# Patient Record
Sex: Female | Born: 1990 | Race: White | Hispanic: No | Marital: Single | Smoking: Former smoker
Health system: Southern US, Community
[De-identification: ages and names within clinical notes are randomized; demographics above are authoritative.]

## PROBLEM LIST (undated history)

## (undated) DIAGNOSIS — N946 Dysmenorrhea, unspecified: Secondary | ICD-10-CM

## (undated) DIAGNOSIS — B0059 Other herpesviral disease of eye: Secondary | ICD-10-CM

## (undated) DIAGNOSIS — F32A Depression, unspecified: Secondary | ICD-10-CM

## (undated) DIAGNOSIS — F419 Anxiety disorder, unspecified: Secondary | ICD-10-CM

## (undated) DIAGNOSIS — Z87440 Personal history of urinary (tract) infections: Secondary | ICD-10-CM

## (undated) DIAGNOSIS — L309 Dermatitis, unspecified: Secondary | ICD-10-CM

## (undated) DIAGNOSIS — R87619 Unspecified abnormal cytological findings in specimens from cervix uteri: Secondary | ICD-10-CM

## (undated) DIAGNOSIS — F329 Major depressive disorder, single episode, unspecified: Secondary | ICD-10-CM

## (undated) DIAGNOSIS — E282 Polycystic ovarian syndrome: Secondary | ICD-10-CM

## (undated) HISTORY — DX: Dysmenorrhea, unspecified: N94.6

## (undated) HISTORY — DX: Personal history of urinary (tract) infections: Z87.440

## (undated) HISTORY — DX: Dermatitis, unspecified: L30.9

## (undated) HISTORY — DX: Other herpesviral disease of eye: B00.59

## (undated) HISTORY — DX: Unspecified abnormal cytological findings in specimens from cervix uteri: R87.619

## (undated) HISTORY — DX: Polycystic ovarian syndrome: E28.2

---
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
## 2012-10-01 ENCOUNTER — Encounter (HOSPITAL_COMMUNITY): Payer: Self-pay | Admitting: Emergency Medicine

## 2012-10-01 ENCOUNTER — Emergency Department (INDEPENDENT_AMBULATORY_CARE_PROVIDER_SITE_OTHER)
Admission: EM | Admit: 2012-10-01 | Discharge: 2012-10-01 | Disposition: A | Discharge status: Home / Self Care | Payer: BC Managed Care – PPO | Source: Home / Self Care | Attending: Emergency Medicine | Admitting: Emergency Medicine

## 2012-10-01 DIAGNOSIS — B0050 Herpesviral ocular disease, unspecified: Secondary | ICD-10-CM

## 2012-10-01 MED ORDER — VALACYCLOVIR HCL 500 MG PO TABS: 500.0000 mg | ORAL_TABLET | Freq: Two times a day (BID) | ORAL | Status: DC

## 2012-10-01 MED ORDER — GANCICLOVIR 0.15 % OP GEL: OPHTHALMIC | Status: DC

## 2012-10-01 MED ORDER — ERYTHROMYCIN 5 MG/GM OP OINT: TOPICAL_OINTMENT | Freq: Four times a day (QID) | OPHTHALMIC | Status: DC

## 2012-10-01 MED ORDER — TETRACAINE HCL 0.5 % OP SOLN
OPHTHALMIC | Status: AC
  Filled 2012-10-01: qty 2

## 2012-10-01 NOTE — ED Notes (Signed)
Left eye pain, watery, red -noticed a scratchy feeling on Thursday, symptoms have progressed since then

## 2012-10-01 NOTE — ED Notes (Signed)
Patient not in room

## 2012-10-01 NOTE — ED Provider Notes (Signed)
Chief Complaint:   Chief Complaint  Patient presents with  . Eye Pain    History of Present Illness:   Savannah Patel is a 22 year old female who's had a three-day history of pain and redness in her left eye. The eye has been watering and she's had some purulent discharge and some crusting. The eye is painful, scratchy, and she has some headache on that side. Her vision is blurred and she is light sensitive. The patient furthermore gives a history of recurring herpes simplex of the cornea. This is been going on yearly since she was 22 years old. She is being followed by a Dr. Audria Nine in [place] whose phone number is [phone]. She's not taking any medication for this right now. She states that this episode feels like a typical flareup of her herpes simplex.  Review of Systems:  Other than noted above, the patient denies any of the following symptoms: Systemic:  No fever, chills, sweats, fatigue, or weight loss. Eye:  No redness, eye pain, photophobia, discharge, blurred vision, or diplopia. ENT:  No nasal congestion, rhinorrhea, or sore throat. Lymphatic:  No adenopathy. Skin:  No rash or pruritis.  PMFSH:  Past medical history, family history, social history, meds, and allergies were reviewed.   Physical Exam:   Vital signs:  BP 109/66  Pulse 68  Temp(Src) 97.9 F (36.6 C) (Oral)  Resp 17  SpO2 98%  LMP 09/10/2012 General:  Alert and in no distress. Eye:  Eye lids were normal. The conjunctiva is injected and there is watery drainage. Anterior chambers normal, PERRLA, full EOMs, fundi benign. The cornea was stained with fluorescein yielding an ulceration of the cornea just medial to the pupil which was somewhat jagged in nature. ENT:  TMs and canals clear.  Nasal mucosa normal.  No intra-oral lesions, mucous membranes moist, pharynx clear. Neck:  No adenopathy tenderness or mass. Skin:  Clear, warm and dry.    Course in Urgent Care Center:   I called her ophthalmologist, Dr.  Audria Nine who was kind enough to answer me right away and he gave me some very good advice on what to do. He recommended ganciclovir gel 5 times a day until clear and then 3 times a day and oral Valtrex 500 mg 3 times a day for 7-10 days. He wanted to see her early next week in his office.  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 Her ophthalmologist has given the explicit instructions on how to treat this and wishes to see her early next week. She understands these instructions and will call him on Monday morning first thing.  Plan:   1.  The following meds were prescribed:   Discharge Medication List as of 10/01/2012  6:13 PM    START taking these medications   Details  erythromycin ophthalmic ointment Place into the left eye every 6 (six) hours., Starting 10/01/2012, Until Discontinued, Normal    Ganciclovir (ZIRGAN) 0.15 % GEL Apply to left eye every 4 hours while awake, 5 times daily., Normal    valACYclovir (VALTREX) 500 MG tablet Take 1 tablet (500 mg total) by mouth 2 (two) times daily., Starting 10/01/2012, Until Discontinued, Normal       2.  The patient was instructed in symptomatic care and handouts were given. 3.  The patient was told to return if becoming worse in any way, if no better in 3 or 4 days, and given some red  flag symptoms such as change in vision or worsening pain that would indicate earlier return. 4.  Follow up with Dr. Audria Nine next week.     Reuben Likes, MD 10/01/12 [phone]

## 2012-10-27 ENCOUNTER — Encounter: Payer: Self-pay | Admitting: Gynecology

## 2012-10-27 ENCOUNTER — Ambulatory Visit (INDEPENDENT_AMBULATORY_CARE_PROVIDER_SITE_OTHER): Payer: BC Managed Care – PPO | Admitting: Gynecology

## 2012-10-27 VITALS — BP 102/68 | HR 72 | Resp 16 | Ht 65.5 in | Wt 107.0 lb

## 2012-10-27 DIAGNOSIS — Z01419 Encounter for gynecological examination (general) (routine) without abnormal findings: Secondary | ICD-10-CM

## 2012-10-27 DIAGNOSIS — Z0000 Encounter for general adult medical examination without abnormal findings: Secondary | ICD-10-CM

## 2012-10-27 DIAGNOSIS — E282 Polycystic ovarian syndrome: Secondary | ICD-10-CM

## 2012-10-27 DIAGNOSIS — Z113 Encounter for screening for infections with a predominantly sexual mode of transmission: Secondary | ICD-10-CM

## 2012-10-27 DIAGNOSIS — Z309 Encounter for contraceptive management, unspecified: Secondary | ICD-10-CM

## 2012-10-27 DIAGNOSIS — Z124 Encounter for screening for malignant neoplasm of cervix: Secondary | ICD-10-CM

## 2012-10-27 LAB — POCT URINALYSIS DIPSTICK: Urobilinogen, UA: NEGATIVE

## 2012-10-27 LAB — HEMOGLOBIN, FINGERSTICK: Hemoglobin, fingerstick: 13.8 g/dL (ref 12.0–16.0)

## 2012-10-27 MED ORDER — MISOPROSTOL 200 MCG PO TABS: ORAL_TABLET | ORAL | Status: DC

## 2012-10-27 MED ORDER — MEDROXYPROGESTERONE ACETATE 10 MG PO TABS: 10.0000 mg | ORAL_TABLET | Freq: Every day | ORAL | Status: DC

## 2012-10-27 NOTE — Progress Notes (Signed)
22 y.o. Single Caucasian female  G0 here for annual exam. Pt is currently sexually active.  She reports using condoms on a regular basis (most of the time).  First sexual activity at 22 years old, 4 number of lifetime partners. First female partner at 37.  "UTI" began 1.57m ago with the female partner, intermittent condom use, burning, uncomfortable.  Pt does not void immediately after sex.  Current partner for 73m.   Pt reports has PCOS with oligomenorrhea-approx 4 spontaneous cycles/y.  Has been treated with OC but reports emotional side effects-suicidal ideation, no attempts, lasted 47m, 6y ago.  Pt had seen psych during this time-no rx given.  Pt does not recall oc used.  Pt was in lesbian relationship at that time and did not need contraception.  Patient's last menstrual period was 10/13/2012.          Sexually active: yes  The current method of family planning is condoms.    Exercising: yes  jogging,walking,swimming 2x/wk Last pap: never had one Alcohol: 3 drinks/wk Tobacco: 1/2 pack qd Drugs: no Gardisil: yes, completed: 2009  Hgb: 13.8     Urine Blood 2 ; Leuks 1   No health maintenance topics applied.  Family History  Problem Relation Age of Onset  . Colon cancer Mother   . Hypertension Mother   . Ovarian cysts Mother   . Osteoporosis Maternal Grandmother   . Brain cancer Paternal Grandfather     Patient Active Problem List   Diagnosis Date Noted  . PCOS (polycystic ovarian syndrome)     Past Medical History  Diagnosis Date  . PCOS (polycystic ovarian syndrome) 2007  . Dysmenorrhea   . Herpes simplex infection of eyelid   . History of recurrent UTIs     No past surgical history on file.  Allergies: Azithromycin and Penicillins  Current Outpatient Prescriptions  Medication Sig Dispense Refill  . Loratadine (CLARITIN PO) Take by mouth daily.       No current facility-administered medications for this visit.    ROS: Pertinent items are noted in HPI.  Exam:    LMP  10/13/2012 Weight change: @WEIGHTCHANGE @ Last 3 height recordings:  Ht Readings from Last 3 Encounters:  No data found for Ht   General appearance: alert, cooperative and appears stated age Head: Normocephalic, without obvious abnormality, atraumatic Neck: no adenopathy, no carotid bruit, no JVD, supple, symmetrical, trachea midline and thyroid not enlarged, symmetric, no tenderness/mass/nodules Lungs: clear to auscultation bilaterally Breasts: normal appearance, no masses or tenderness Heart: regular rate and rhythm, S1, S2 normal, no murmur, click, rub or gallop Abdomen: soft, non-tender; bowel sounds normal; no masses,  no organomegaly Extremities: extremities normal, atraumatic, no cyanosis or edema Skin: Skin color, texture, turgor normal. No rashes or lesions Lymph nodes: Cervical, supraclavicular, and axillary nodes normal. no inguinal nodes palpated Neurologic: Grossly normal   Pelvic: External genitalia:  no lesions              Urethra: normal appearing urethra with no masses, tenderness or lesions              Bartholins and Skenes: normal                 Vagina: normal appearing vagina with normal color and discharge, no lesions              Cervix: normal appearance              Pap taken: yes  Bimanual Exam:  Uterus:  uterus is normal size, shape, consistency and nontender                                      Adnexa:    normal adnexa in size, nontender and no masses                                      Rectovaginal: Confirms                                      Anus:  normal sphincter tone, no lesions  A: well woman Contraceptive management     P: pap smear guidelines reviewed counseled on breast self exam, STD prevention, smoking cessation 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   An After Visit Summary was printed and given to the patient.

## 2012-10-27 NOTE — Patient Instructions (Signed)
Polycystic Ovarian Syndrome Polycystic ovarian syndrome is a condition with a number of problems. One problem is with the ovaries. The ovaries are organs located in the female pelvis, on each side of the uterus. Usually, during the menstrual cycle, an egg is released from 1 ovary every month. This is called ovulation. When the egg is fertilized, it goes into the womb (uterus), which allows for the growth of a baby. The egg travels from the ovary through the fallopian tube to the uterus. The ovaries also make the hormones estrogen and progesterone. These hormones help the development of a woman's breasts, body shape, and body hair. They also regulate the menstrual cycle and pregnancy. Sometimes, cysts form in the ovaries. A cyst is a fluid-filled sac. On the ovary, different types of cysts can form. The most common type of ovarian cyst is called a functional or ovulation cyst. It is normal, and often forms during the normal menstrual cycle. Each month, a woman's ovaries grow tiny cysts that hold the eggs. When an egg is fully grown, the sac breaks open. This releases the egg. Then, the sac which released the egg from the ovary dissolves. In one type of functional cyst, called a follicle cyst, the sac does not break open to release the egg. It may actually continue to grow. This type of cyst usually disappears within 1 to 3 months.  One type of cyst problem with the ovaries is called Polycystic Ovarian Syndrome (PCOS). In this condition, many follicle cysts form, but do not rupture and produce an egg. This health problem can affect the following:  Menstrual cycle.  Heart.  Obesity.  Cancer of the uterus.  Fertility.  Blood vessels.  Hair growth (face and body) or baldness.  Hormones.  Appearance.  High blood pressure.  Stroke.  Insulin production.  Inflammation of the liver.  Elevated blood cholesterol and triglycerides. CAUSES   No one knows the exact cause of PCOS.  Women with  PCOS often have a mother or sister with PCOS. There is not yet enough proof to say this is inherited.  Many women with PCOS have a weight problem.  Researchers are looking at the relationship between PCOS and the body's ability to make insulin. Insulin is a hormone that regulates the change of sugar, starches, and other food into energy for the body's use, or for storage. Some women with PCOS make too much insulin. It is possible that the ovaries react by making too many female hormones, called androgens. This can lead to acne, excessive hair growth, weight gain, and ovulation problems.  Too much production of luteinizing hormone (LH) from the pituitary gland in the brain stimulates the ovary to produce too much female hormone (androgen). SYMPTOMS   Infrequent or no menstrual periods, and/or irregular bleeding.  Inability to get pregnant (infertility), because of not ovulating.  Increased growth of hair on the face, chest, stomach, back, thumbs, thighs, or toes.  Acne, oily skin, or dandruff.  Pelvic pain.  Weight gain or obesity, usually carrying extra weight around the waist.  Type 2 diabetes (this is the diabetes that usually does not need insulin).  High cholesterol.  High blood pressure.  Female-pattern baldness or thinning hair.  Patches of thickened and dark brown or black skin on the neck, arms, breasts, or thighs.  Skin tags, or tiny excess flaps of skin, in the armpits or neck area.  Sleep apnea (excessive snoring and breathing stops at times while asleep).  Deepening of the voice.  Gestational diabetes when pregnant.  Increased risk of miscarriage with pregnancy. DIAGNOSIS  There is no single test to diagnose PCOS.   Your caregiver will:  Take a medical history.  Perform a pelvic exam.  Perform an ultrasound.  Check your female and female hormone levels.  Measure glucose or sugar levels in the blood.  Do other blood tests.  If you are producing too many  female hormones, your caregiver will make sure it is from PCOS. At the physical exam, your caregiver will want to evaluate the areas of increased hair growth. Try to allow natural hair growth for a few days before the visit.  During a pelvic exam, the ovaries may be enlarged or swollen by the increased number of small cysts. This can be seen more easily by vaginal ultrasound or screening, to examine the ovaries and lining of the uterus (endometrium) for cysts. The uterine lining may become thicker, if there has not been a regular period. TREATMENT  Because there is no cure for PCOS, it needs to be managed to prevent problems. Treatments are based on your symptoms. Treatment is also based on whether you want to have a baby or whether you need contraception.  Treatment may include:  Progesterone hormone, to start a menstrual period.  Birth control pills, to make you have regular menstrual periods.  Medicines to make you ovulate, if you want to get pregnant.  Medicines to control your insulin.  Medicine to control your blood pressure.  Medicine and diet, to control your high cholesterol and triglycerides in your blood.  Surgery, making small holes in the ovary, to decrease the amount of female hormone production. This is done through a long, lighted tube (laparoscope), placed into the pelvis through a tiny incision in the lower abdomen. Your caregiver will go over some of the choices with you. WOMEN WITH PCOS HAVE THESE CHARACTERISTICS:  High levels of female hormones called androgens.  An irregular or no menstrual cycle.  May have many small cysts in their ovaries. PCOS is the most common hormonal reproductive problem in women of childbearing age. WHY DO WOMEN WITH PCOS HAVE TROUBLE WITH THEIR MENSTRUAL CYCLE? Each month, about 20 eggs start to mature in the ovaries. As one egg grows and matures, the follicle breaks open to release the egg, so it can travel through the fallopian tube for  fertilization. When the single egg leaves the follicle, ovulation takes place. In women with PCOS, the ovary does not make all of the hormones it needs for any of the eggs to fully mature. They may start to grow and accumulate fluid, but no one egg becomes large enough. Instead, some may remain as cysts. Since no egg matures or is released, ovulation does not occur and the hormone progesterone is not made. Without progesterone, a woman's menstrual cycle is irregular or absent. Also, the cysts produce female hormones, which continue to prevent ovulation.  Document Released: 08/14/2004 Document Revised: 07/13/2011 Document Reviewed: 03/08/2009 [facility] Patient Information 2014 [place], [place]. Levonorgestrel intrauterine device (IUD) What is this medicine? LEVONORGESTREL IUD (LEE voe nor jes trel) is a contraceptive (birth control) device. It is used to prevent pregnancy and to treat heavy bleeding that occurs during your period. It can be used for up to 5 years. This medicine may be used for other purposes; ask your health care provider or pharmacist if you have questions. What should I tell my health care provider before I take this medicine? They need to know if you have  any of these conditions: -abnormal Pap smear -cancer of the breast, uterus, or cervix -diabetes -endometritis -genital or pelvic infection now or in the past -have more than one sexual partner or your partner has more than one partner -heart disease -history of an ectopic or tubal pregnancy -immune system problems -IUD in place -liver disease or tumor -problems with blood clots or take blood-thinners -use intravenous drugs -uterus of unusual shape -vaginal bleeding that has not been explained -an unusual or allergic reaction to levonorgestrel, other hormones, silicone, or polyethylene, medicines, foods, dyes, or preservatives -pregnant or trying to get pregnant -breast-feeding How should I use this medicine? This device is  placed inside the uterus by a health care professional. Talk to your pediatrician regarding the use of this medicine in children. Special care may be needed. Overdosage: If you think you have taken too much of this medicine contact a poison control center or emergency room at once. NOTE: This medicine is only for you. Do not share this medicine with others. What if I miss a dose? This does not apply. What may interact with this medicine? Do not take this medicine with any of the following medications: -amprenavir -bosentan -fosamprenavir This medicine may also interact with the following medications: -aprepitant -barbiturate medicines for inducing sleep or treating seizures -bexarotene -griseofulvin -medicines to treat seizures like carbamazepine, ethotoin, felbamate, oxcarbazepine, phenytoin, topiramate -modafinil -pioglitazone -rifabutin -rifampin -rifapentine -some medicines to treat HIV infection like atazanavir, indinavir, lopinavir, nelfinavir, tipranavir, ritonavir -St. John's wort -warfarin This list may not describe all possible interactions. Give your health care provider a list of all the medicines, herbs, non-prescription drugs, or dietary supplements you use. Also tell them if you smoke, drink alcohol, or use illegal drugs. Some items may interact with your medicine. What should I watch for while using this medicine? Visit your doctor or health care professional for regular check ups. See your doctor if you or your partner has sexual contact with others, becomes HIV positive, or gets a sexual transmitted disease. This product does not protect you against HIV infection (AIDS) or other sexually transmitted diseases. You can check the placement of the IUD yourself by reaching up to the top of your vagina with clean fingers to feel the threads. Do not pull on the threads. It is a good habit to check placement after each menstrual period. Call your doctor right away if you feel  more of the IUD than just the threads or if you cannot feel the threads at all. The IUD may come out by itself. You may become pregnant if the device comes out. If you notice that the IUD has come out use a backup birth control method like condoms and call your health care provider. Using tampons will not change the position of the IUD and are okay to use during your period. What side effects may I notice from receiving this medicine? Side effects that you should report to your doctor or health care professional as soon as possible: -allergic reactions like skin rash, itching or hives, swelling of the face, lips, or tongue -fever, flu-like symptoms -genital sores -high blood pressure -no menstrual period for 6 weeks during use -pain, swelling, warmth in the leg -pelvic pain or tenderness -severe or sudden headache -signs of pregnancy -stomach cramping -sudden shortness of breath -trouble with balance, talking, or walking -unusual vaginal bleeding, discharge -yellowing of the eyes or skin Side effects that usually do not require medical attention (report to your doctor or health care  professional if they continue or are bothersome): -acne -breast pain -change in sex drive or performance -changes in weight -cramping, dizziness, or faintness while the device is being inserted -headache -irregular menstrual bleeding within first 3 to 6 months of use -nausea This list may not describe all possible side effects. Call your doctor for medical advice about side effects. You may report side effects to FDA at 1-800-FDA-1088. Where should I keep my medicine? This does not apply. NOTE: This sheet is a summary. It may not cover all possible information. If you have questions about this medicine, talk to your doctor, pharmacist, or health care provider.  2012, [employer]vier/[employer] Standard. (05/11/2008 6:39:08 PM)

## 2012-10-28 LAB — RPR

## 2012-10-28 LAB — HIV ANTIBODY (ROUTINE TESTING W REFLEX): HIV: NONREACTIVE

## 2012-10-30 LAB — URINE CULTURE

## 2012-10-31 ENCOUNTER — Telehealth: Payer: Self-pay | Admitting: *Deleted

## 2012-10-31 ENCOUNTER — Other Ambulatory Visit: Payer: Self-pay | Admitting: Gynecology

## 2012-10-31 MED ORDER — CIPROFLOXACIN HCL 500 MG PO TABS: 500.0000 mg | ORAL_TABLET | Freq: Two times a day (BID) | ORAL | Status: DC

## 2012-10-31 NOTE — Telephone Encounter (Signed)
Message copied by Lorraine Lax on Mon Oct 31, 2012  5:41 PM ------      Message from: Douglass Rivers      Created: Mon Oct 31, 2012  4:20 AM       +UTI, Cipro sent to pharmacy, please inform pt, if not better after treatment, should come in for test of cure urine,      Also BV on PAP, recall 2, may treat if symptoms, metrogel for 5 nights-not called in      GC, CTM neg ------

## 2012-10-31 NOTE — Telephone Encounter (Signed)
Left Message To Call Back  

## 2012-11-01 ENCOUNTER — Other Ambulatory Visit: Payer: Self-pay | Admitting: *Deleted

## 2012-11-01 MED ORDER — METRONIDAZOLE 0.75 % VA GEL: 1.0000 | Freq: Two times a day (BID) | VAGINAL | Status: DC

## 2012-11-01 NOTE — Telephone Encounter (Signed)
Per Dr. Farrel Gobble pt had BV on papsmear (see labs) okay to send in metrogel x 5 nights. [facility] sent [facility] to [facility] pt is aware.

## 2012-11-02 ENCOUNTER — Telehealth: Payer: Self-pay | Admitting: Gynecology

## 2012-11-02 NOTE — Telephone Encounter (Signed)
LMTCB to discuss insurance benefits for IUD placement and the process for scheduling placement.

## 2012-11-03 NOTE — Telephone Encounter (Signed)
Pt notified 11/02/12 see labs 

## 2012-11-21 ENCOUNTER — Telehealth: Payer: Self-pay | Admitting: Gynecology

## 2012-11-21 NOTE — Telephone Encounter (Signed)
Patient needs to schedule and appointment for IUD. She started yesterday .

## 2012-11-21 NOTE — Telephone Encounter (Signed)
Will she need cytotec for IUD skyla placement.?

## 2012-11-21 NOTE — Telephone Encounter (Signed)
Patient is 22 y/o G0, seen for NGYN exam on 10-27-12.

## 2012-11-22 ENCOUNTER — Other Ambulatory Visit: Payer: Self-pay | Admitting: Orthopedic Surgery

## 2012-11-22 NOTE — Telephone Encounter (Signed)
Per TL, pt to have Cytotec the night before and the morning of the insertion. RX sent to [facility] [address] LMTCB for pt re: scheduling appt.

## 2012-11-22 NOTE — Telephone Encounter (Signed)
Spoke with pt about scheduling IUD insertion. Offered appt Thursday at 3 pm with TL. Pt cannot come then. Pt started menses on Sunday, and needs placement by Thursday. Pt wishes to wait until next month to schedule. Instructed to call as soon as she starts her cycle. Advised of med Cytotec called to her pharmacy. Pt to take 1 the night before the appt for insertion, and 1 the morning of. Advised pt to take 800 mg ibuprofen an hour before the appt as well with food. Pt agreeable.

## 2012-12-13 NOTE — Telephone Encounter (Signed)
[facility] [address]. Antibiotic is making her sick and is requesting another RX. The patient stopped taking the antibiotic due to it giving her hives. Patient now having back pain and blood in her urine. Please advise?

## 2012-12-13 NOTE — Telephone Encounter (Signed)
Patient calls stating she wants Rx for antibiotic for UTI. States the antibiotic she was given at last visit 10/31/2012 she could not take because it gave her hives. Asked if she let anybody know this and she said NO. Patient did use Metrogel for BV on 11/01/2012. Today she is calling from out of town and is having no fever, is having abdomenal pain and low back pain when she wakes up in the mornings and a lot of blood in her urine for the past couple of days.  Explained to patient that she needs to go as soon as we get through talking to E,R or a Urgent Care to have this seen about.  Explained to patient she will need to follow up here at our office also. Patient said OK but did not sound concerned.

## 2012-12-19 ENCOUNTER — Telehealth: Payer: Self-pay | Admitting: Gynecology

## 2012-12-19 NOTE — Telephone Encounter (Signed)
No voice mail message set up to leave a message on CB#.

## 2012-12-19 NOTE — Telephone Encounter (Signed)
Patient needs to schedule an appointment for IUD placement . Started her period on Saturday.

## 2012-12-20 ENCOUNTER — Telehealth: Payer: Self-pay | Admitting: *Deleted

## 2012-12-20 NOTE — Telephone Encounter (Signed)
Left message on patient CB#VM of need to return call concerning scheduling appt. For IUD placement. Patient also needs to give information concerning UTI diagnosis earlier this month.

## 2013-02-16 ENCOUNTER — Other Ambulatory Visit: Payer: Self-pay | Admitting: Allergy and Immunology

## 2013-02-16 ENCOUNTER — Ambulatory Visit
Admission: RE | Admit: 2013-02-16 | Discharge: 2013-02-16 | Disposition: A | Discharge status: Home / Self Care | Payer: BC Managed Care – PPO | Source: Ambulatory Visit | Attending: Allergy and Immunology | Admitting: Allergy and Immunology

## 2013-02-16 DIAGNOSIS — J45909 Unspecified asthma, uncomplicated: Secondary | ICD-10-CM

## 2013-02-17 ENCOUNTER — Ambulatory Visit (INDEPENDENT_AMBULATORY_CARE_PROVIDER_SITE_OTHER): Payer: BC Managed Care – PPO | Admitting: Gynecology

## 2013-02-17 VITALS — BP 100/56 | HR 66 | Resp 18 | Ht 65.5 in | Wt 112.0 lb

## 2013-02-17 DIAGNOSIS — Z309 Encounter for contraceptive management, unspecified: Secondary | ICD-10-CM

## 2013-02-17 DIAGNOSIS — E282 Polycystic ovarian syndrome: Secondary | ICD-10-CM

## 2013-02-17 NOTE — Progress Notes (Signed)
71 yrsSingle Caucasian female presents for  insertion of skyla. Denies any vaginal symptoms or STD concerns.  Pt reports LMP end of 02/15/2013 but last sex was early August.  Pt reports having negative upt at PCP wants IUD for treatment of severe PCOS, could not tolerate ocp due to suicidal ideation related to estrogen.  Was in lesbian relationship at that time so contraception not needed  Pt has had first female partner at 68 and most recently has had sex with female  LMP: 02/15/2013  Patient read information regarding IUD insertion.  All questions addressed.    Healthy female,time, place and personno breast pain or new or enlarging lumps on self exam Abdomen: soft, non-tender Groinno inguinal nodes palpated  Pelvic exam: Vulva;normal  Vagina:normal vagina  Cervix:Non-tender, Negative CMT, no lesions or redness, nulliparous/parous os  Uterus:normal shape, position and consistency, retroverted, mobile, non tender   Lab:neg GC/CTM  Procedure:  Bimanual exam for orientation of uterus, Speculum inserted into vagina. Cervix visualized and cleansed with hibiclens solution X 3. Tenaculumgently placed on cervix at 12 o'clock position(s).  Uterus sounded to 6 centimeters.  IUD removed from sterile packet and under sterile conditions inserted to fundus of uterus.  Introducer removed without difficulty.  IUD string trimmed to 2 centimeters.  Remainder string given to patient to feel for identification.  Tenaculum removed.  No bleeding noted.  Speculum removed.  Uterus palpated normal.  Patient tolerated procedure well.  A: Insertion of Lot # [id number], Expiration date 1/17, SKYLA   P:  Instructions and warnings signs given.       IUD identification card given with IUD removal 02/2106       Return visit 22m

## 2013-03-23 ENCOUNTER — Telehealth: Payer: Self-pay | Admitting: Gynecology

## 2013-03-23 NOTE — Telephone Encounter (Signed)
Pt says she has been bleeding for a month and would like an appointment for fu from skyla procedure.

## 2013-03-23 NOTE — Telephone Encounter (Signed)
Spoke with patient. She states she has been having vaginal bleeding since she was on her period and had Skyla inserted. Some days are heavier than others. States today is "a light day" and wearing liner. She is having intermittent cramps. Patient is due for IUD recheck as well. She denies fevers or abdominal pain, just intermittent cramps. Denies vaginal discharge.  Offered OV for today (with another provider) and tomorrow with Dr. Farrel Gobble, patient declines, has testing scheduled. Offered Monday, patient is agreeable to 6:30 appointment.   Advised patient to call back if bleeding worsens or soaking through 1 pad/tampon per hour, that is an emergency and will need immediate attention. Patient verbalized understanding. Will call back prn and will come for appointment.

## 2013-03-27 ENCOUNTER — Ambulatory Visit (INDEPENDENT_AMBULATORY_CARE_PROVIDER_SITE_OTHER): Payer: BC Managed Care – PPO | Admitting: Gynecology

## 2013-03-27 ENCOUNTER — Telehealth: Payer: Self-pay | Admitting: *Deleted

## 2013-03-27 VITALS — BP 98/68 | HR 80 | Resp 12 | Ht 65.5 in | Wt <= 1120 oz

## 2013-03-27 DIAGNOSIS — Z975 Presence of (intrauterine) contraceptive device: Secondary | ICD-10-CM

## 2013-03-27 NOTE — Progress Notes (Signed)
Subjective:     Patient ID: Savannah Patel, female   DOB: 29-Oct-1990, 22 y.o.   MRN: [id number]  HPI Comments: Pt here for IUD check, placed 6w ago, using approx 2 pantilines/d.  noticed an increase in bleeding for 2d 2w ago c/w menses.  Bleeding profile improving.  Pt reports still cramping, using 1 midol occassionally.  Not been sexually active.    Review of Systems  Constitutional: Negative for fever and chills.  Genitourinary: Positive for vaginal bleeding. Negative for vaginal discharge, vaginal pain and pelvic pain.       Objective:   Physical Exam  Constitutional: She is oriented to person, place, and time. She appears well-developed and well-nourished.  Genitourinary: There is no rash or tenderness on the right labia. There is no rash or tenderness on the left labia.  Lymphadenopathy:       Right: No inguinal adenopathy present.       Left: No inguinal adenopathy present.  Neurological: She is alert and oriented to person, place, and time.  Pelvic exam: VULVA: normal appearing vulva with no masses, tenderness or lesions, VAGINA: normal appearing vagina with normal color and discharge, no lesions, CERVIX: normal appearing cervix without discharge or lesions, cervical motion tenderness absent,IUD strings visualized UTERUS: uterus is normal size, shape, consistency and nontender, ADNEXA: normal adnexa in size, nontender and no masses.      Assessment:     IUD check     Plan:     Doing well  Pt assured re bleeding profile Can use NSAIDS if needed F/u prn

## 2013-03-27 NOTE — Telephone Encounter (Signed)
Call to patient to adjust appointment time. Unable to leave message. VM states not available, unable to leave message.

## 2013-05-22 NOTE — Telephone Encounter (Signed)
Appt completed  03-27-13.  To provider for signature. Encounter closed.

## 2013-10-16 ENCOUNTER — Telehealth: Payer: Self-pay | Admitting: Gynecology

## 2013-10-16 ENCOUNTER — Ambulatory Visit: Payer: BC Managed Care – PPO | Admitting: Certified Nurse Midwife

## 2013-10-16 ENCOUNTER — Ambulatory Visit (INDEPENDENT_AMBULATORY_CARE_PROVIDER_SITE_OTHER): Payer: BC Managed Care – PPO | Admitting: Gynecology

## 2013-10-16 VITALS — BP 96/54 | Temp 98.8°F | Resp 12 | Wt 109.0 lb

## 2013-10-16 DIAGNOSIS — R300 Dysuria: Secondary | ICD-10-CM

## 2013-10-16 DIAGNOSIS — N898 Other specified noninflammatory disorders of vagina: Secondary | ICD-10-CM

## 2013-10-16 DIAGNOSIS — Z975 Presence of (intrauterine) contraceptive device: Secondary | ICD-10-CM | POA: Insufficient documentation

## 2013-10-16 LAB — POCT URINALYSIS DIPSTICK
Blood, UA: 2
UROBILINOGEN UA: NEGATIVE
pH, UA: 5

## 2013-10-16 LAB — POCT WET PREP (WET MOUNT): Clue Cells Wet Prep Whiff POC: NEGATIVE

## 2013-10-16 MED ORDER — FLUCONAZOLE 150 MG PO TABS: 150.0000 mg | ORAL_TABLET | Freq: Once | ORAL | Status: DC

## 2013-10-16 NOTE — Telephone Encounter (Signed)
Pt having discomfort while urinating.

## 2013-10-16 NOTE — Progress Notes (Signed)
Subjective:     Patient ID: Savannah BisonJessica Patel, female   DOB: 06/07/1990, 23 y.o.   MRN: [id number]  HPI Comments: Pt here reporting 3d of clumpy discharge, itching and irritation, white, mild odor.  Pt did not use otc to treat, has concerns that she may have an STD, recent unprotected sex 1w ago. Pt reports never had yeast infection. No cycle on skyla  Dysuria  This is a new problem. The current episode started in the past 7 days. The problem has been unchanged. The quality of the pain is described as burning. There has been no fever. She is sexually active. Associated symptoms include a discharge and hesitancy. Pertinent negatives include no chills, frequency, hematuria or urgency. She has tried nothing for the symptoms.     Review of Systems  Constitutional: Negative for chills.  Genitourinary: Positive for dysuria and hesitancy. Negative for urgency, frequency and hematuria.       Objective:   Physical Exam  Nursing note and vitals reviewed. Constitutional: She is oriented to person, place, and time. She appears well-developed and well-nourished.  Neurological: She is alert and oriented to person, place, and time.  Skin: Skin is warm and dry.   Pelvic: External genitalia:  no lesions              Urethra:  normal appearing urethra with no masses, tenderness or lesions              Bartholins and Skenes: normal                 Vagina: scant clumpy discharge              Cervix: normal appearance, no discharge, strings noted, no CMT                      Bimanual Exam:  Uterus:  uterus is normal size, shape, consistency and nontender                                      Adnexa: normal adnexa in size, nontender and no masses                                        WP: few yeast    Assessment:     Symptomatic yeast     Plan:     fluconazole

## 2013-10-16 NOTE — Telephone Encounter (Addendum)
Spoke with patient. She states "I don't think this is a UTI" with c/o dysuria, "slight" abdominal pain, "weird" discharge that is thick and foul smelling per patient x 3 days. No fevers. Feels IUD strings. Can come for office visit, scheduled for 1600 with Dr. Farrel GobbleLathrop, time per Dr. Farrel GobbleLathrop.   Routing to provider for final review. Patient agreeable to disposition. Will close encounter.

## 2013-10-19 LAB — IPS N GONORRHOEA AND CHLAMYDIA BY PCR

## 2013-10-19 LAB — URINE CULTURE: Colony Count: 15000

## 2013-10-20 MED ORDER — SULFAMETHOXAZOLE-TRIMETHOPRIM 800-160 MG PO TABS: 1.0000 | ORAL_TABLET | Freq: Two times a day (BID) | ORAL | Status: DC

## 2013-10-20 NOTE — Addendum Note (Signed)
Addended by: Douglass RiversLATHROP, TRACY on: 10/20/2013 07:17 AM   Modules accepted: Orders

## 2014-01-29 ENCOUNTER — Ambulatory Visit: Payer: BC Managed Care – PPO | Admitting: Gynecology

## 2014-01-31 ENCOUNTER — Encounter: Payer: Self-pay | Admitting: Gynecology

## 2014-01-31 ENCOUNTER — Ambulatory Visit (INDEPENDENT_AMBULATORY_CARE_PROVIDER_SITE_OTHER): Payer: BC Managed Care – PPO | Admitting: Gynecology

## 2014-01-31 VITALS — BP 98/68 | HR 84 | Resp 18 | Ht 66.0 in | Wt 105.0 lb

## 2014-01-31 DIAGNOSIS — Z01419 Encounter for gynecological examination (general) (routine) without abnormal findings: Secondary | ICD-10-CM

## 2014-01-31 DIAGNOSIS — N898 Other specified noninflammatory disorders of vagina: Secondary | ICD-10-CM

## 2014-01-31 DIAGNOSIS — Z87440 Personal history of urinary (tract) infections: Secondary | ICD-10-CM

## 2014-01-31 DIAGNOSIS — Z124 Encounter for screening for malignant neoplasm of cervix: Secondary | ICD-10-CM

## 2014-01-31 DIAGNOSIS — Z113 Encounter for screening for infections with a predominantly sexual mode of transmission: Secondary | ICD-10-CM

## 2014-01-31 DIAGNOSIS — Z0000 Encounter for general adult medical examination without abnormal findings: Secondary | ICD-10-CM

## 2014-01-31 LAB — POCT URINALYSIS DIPSTICK
UROBILINOGEN UA: NEGATIVE
pH, UA: 7

## 2014-01-31 LAB — POCT WET PREP (WET MOUNT): Clue Cells Wet Prep Whiff POC: NEGATIVE

## 2014-01-31 LAB — RPR

## 2014-01-31 LAB — HEMOGLOBIN, FINGERSTICK: HEMOGLOBIN, FINGERSTICK: 14.5 g/dL (ref 12.0–16.0)

## 2014-01-31 MED ORDER — NITROFURANTOIN MONOHYD MACRO 100 MG PO CAPS: ORAL_CAPSULE | ORAL | Status: DC

## 2014-01-31 MED ORDER — FLUCONAZOLE 150 MG PO TABS: 150.0000 mg | ORAL_TABLET | Freq: Once | ORAL | Status: DC

## 2014-01-31 NOTE — Progress Notes (Signed)
23 y.o. Caucasian Caucasian female   No obstetric history on file. here for annual exam. Pt is  currently sexually active.  She reports using condoms on a regular basis.  First sexual activity at 23 years old, 5315  number of lifetime partners.  Pt has a new partner and would like STD screen, intermittent condom use.  Recent UTI, on cipro feeling much better.  Pt reports itchy vaginal discharge that started yesterday, boyfriend has jock itch.  t reports recurrent UTI after sex despite voiding immediately after and before.  Patient's last menstrual period was 11/01/2013.          Sexually active: Yes.    The current method of family planning is Skyla  IUD.    Exercising: Yes.    gym 3x/wk Last pap: 10/27/12 Negative  Alcohol:occasionaly weekends  Tobacco:  1 pack/qd  Drugs: no Gardisil: yes, completed: Age 89  Hgb: 14.5 ; Urine: Blood 2, Trace Leuks  Health Maintenance  Topic Date Due  . Tetanus/tdap  03/26/2009  . Influenza Vaccine  12/02/2013  . Pap Smear  10/28/2015    Family History  Problem Relation Age of Onset  . Colon cancer Mother   . Hypertension Mother   . Ovarian cysts Mother   . Osteoporosis Maternal Grandmother   . Brain cancer Paternal Grandfather     Patient Active Problem List   Diagnosis Date Noted  . IUD (intrauterine device) in place 10/16/2013  . PCOS (polycystic ovarian syndrome)     Past Medical History  Diagnosis Date  . PCOS (polycystic ovarian syndrome) 2007  . Dysmenorrhea   . Herpes simplex infection of eyelid   . History of recurrent UTIs     History reviewed. No pertinent past surgical history.  Allergies: Iodine; Azithromycin; and Penicillins  Current Outpatient Prescriptions  Medication Sig Dispense Refill  . Levonorgestrel (SKYLA) 13.5 MG IUD by Intrauterine route.      . Loratadine (CLARITIN PO) Take by mouth daily.      Marland Kitchen. sulfamethoxazole-trimethoprim (BACTRIM DS) 800-160 MG per tablet Take 1 tablet by mouth 2 (two) times daily. One  PO BID x 3 days  6 tablet  0  . EPINEPHRINE, ANAPHYLAXIS THERAPY AGENTS, 1 applicator as needed.       No current facility-administered medications for this visit.    ROS: Pertinent items are noted in HPI.  Exam:    BP 98/68  Pulse 84  Resp 18  Ht 5\' 6"  (1.676 m)  Wt 105 lb (47.628 kg)  BMI 16.96 kg/m2  LMP 11/01/2013 Weight change: @WEIGHTCHANGE @ Last 3 height recordings:  Ht Readings from Last 3 Encounters:  01/31/14 5\' 6"  (1.676 m)  03/27/13 5' 5.5" (1.664 m)  02/17/13 5' 5.5" (1.664 m)   General appearance: alert, cooperative and appears stated age Head: Normocephalic, without obvious abnormality, atraumatic Neck: no adenopathy, no carotid bruit, no JVD, supple, symmetrical, trachea midline and thyroid not enlarged, symmetric, no tenderness/mass/nodules Lungs: clear to auscultation bilaterally Breasts: Inspection negative, No nipple retraction or dimpling, No nipple discharge or bleeding, No axillary or supraclavicular adenopathy, Normal to palpation without dominant masses Heart: regular rate and rhythm, S1, S2 normal, no murmur, click, rub or gallop Abdomen: soft, non-tender; bowel sounds normal; no masses,  no organomegaly Extremities: extremities normal, atraumatic, no cyanosis or edema Skin: Skin color, texture, turgor normal. No rashes or lesions Lymph nodes: Cervical, supraclavicular, and axillary nodes normal. no inguinal nodes palpated Neurologic: Grossly normal   Pelvic: External genitalia:  no lesions  Urethra: normal appearing urethra with no masses, tenderness or lesions              Bartholins and Skenes: Bartholin's, Urethra, Skene's normal                 Vagina: vaginal discharge - white, copious, curd-like, odorless and thick, WET MOUNT done - results: hyphae, vaginal pH is 4.5              Cervix: normal appearance, strings noted              Pap taken: Yes.          Bimanual Exam:  Uterus:  uterus is normal size, shape, consistency and  nontender                                      Adnexa:    normal adnexa in size, nontender and no masses                                      Rectovaginal: Confirms                                      Anus:  normal sphincter tone, no lesions      P:    1. Routine gynecological examination pap smear counseled on breast self exam, feminine hygiene, diet and exercise return annually or prn Discussed STD prevention, regular condom use.  2. Laboratory examination ordered as part of a routine general medical examination  - POCT Urinalysis Dipstick - Hemoglobin, fingerstick  3. Screening for cervical cancer  - PAP with Reflex to HPV (IPS)  4. Screen for STD (sexually transmitted disease)  - N. gonorrhoea and Chlamydia by PCR (IPS) - RPR - HIV antibody  5. Vaginal discharge Yeast noted on [facility] - POCT Wet Prep (Wet Mount) - fluconazole (DIFLUCAN) 150 MG tablet; Take 1 tablet (150 mg total) by mouth once. Repeat in 3d  Dispense: 2 tablet; Refill: 0  6. History of recurrent cystitis Post-coital, continue with voiding regimen, rto if not controled with prophylaxis  - nitrofurantoin, macrocrystal-monohydrate, (MACROBID) 100 MG capsule; 1 po after sex  Dispense: 30 capsule; Refill: 2   An After Visit Summary was printed and given to the patient.

## 2014-02-01 LAB — HIV ANTIBODY (ROUTINE TESTING W REFLEX): HIV 1&2 Ab, 4th Generation: NONREACTIVE

## 2014-02-02 LAB — IPS PAP TEST WITH REFLEX TO HPV

## 2014-02-02 LAB — IPS N GONORRHOEA AND CHLAMYDIA BY PCR

## 2014-02-05 ENCOUNTER — Telehealth: Payer: Self-pay | Admitting: *Deleted

## 2014-02-05 NOTE — Telephone Encounter (Signed)
Message copied by Lorraine LaxSHAW, Rojelio Uhrich J on Mon Feb 05, 2014  9:41 AM ------      Message from: Douglass RiversLATHROP, TRACY      Created: Fri Feb 02, 2014  8:21 AM       HIV, RPR neg, GC/CTM P ------

## 2014-02-05 NOTE — Telephone Encounter (Signed)
Patient notified see result note 

## 2014-02-05 NOTE — Telephone Encounter (Signed)
Left Message To Call Back  

## 2014-02-05 NOTE — Telephone Encounter (Signed)
Pt returning call

## 2014-05-17 ENCOUNTER — Ambulatory Visit (INDEPENDENT_AMBULATORY_CARE_PROVIDER_SITE_OTHER): Payer: BLUE CROSS/BLUE SHIELD | Admitting: Nurse Practitioner

## 2014-05-17 ENCOUNTER — Encounter: Payer: Self-pay | Admitting: Nurse Practitioner

## 2014-05-17 VITALS — BP 100/60 | HR 56 | Ht 66.0 in | Wt 109.0 lb

## 2014-05-17 DIAGNOSIS — R3915 Urgency of urination: Secondary | ICD-10-CM

## 2014-05-17 DIAGNOSIS — Z113 Encounter for screening for infections with a predominantly sexual mode of transmission: Secondary | ICD-10-CM

## 2014-05-17 LAB — POCT URINALYSIS DIPSTICK
UROBILINOGEN UA: NEGATIVE
pH, UA: 5

## 2014-05-17 MED ORDER — FLUCONAZOLE 150 MG PO TABS: 150.0000 mg | ORAL_TABLET | Freq: Once | ORAL | Status: DC

## 2014-05-17 NOTE — Patient Instructions (Signed)

## 2014-05-17 NOTE — Progress Notes (Signed)
24 y.o. SW Fe G0  here with complaint of vaginal symptoms of itching, burning, and increase discharge. Describes discharge as white cottage cheese.  She has Skyla IUD placement on 02/17/13.  Normal spotting initially and now no menses since 11/01/2013. Onset of symptoms 5 days ago. Recent URI with antibiotics.  Denies new personal products or vaginal dryness.not currently SA since October.  Ended the relationship and concerns about infidelity.  Has STD concerns. Urinary symptoms with  urinary urgency.   O: Healthy female WDWN  Affect: normal, orientation x 3  Exam: NAD Abdomen: soft and non tender Lymph node: no enlargement or tenderness Pelvic exam: External genital: no lesions BUS: negative Vagina: white thick discharge noted. Cervix: normal, non tender Uterus: normal, non tender   A: R/O STD's  Findings consistent with yeast vaginitis   P: Discussed findings of yeast and etiology. Discussed Aveeno or baking soda sitz bath for comfort. Avoid moist clothes or pads for extended period of time. If working out in gym clothes or swim suits for long periods of time change underwear or bottoms of swimsuit if possible. Olive Oil use for skin protection prior to activity can be used to external skin.  Rx: Diflucan 150 mg X 2  Follow with labs and Affirm to R/O STD's  RV prn

## 2014-05-18 LAB — STD PANEL
HEP B S AG: NEGATIVE
HIV 1&2 Ab, 4th Generation: NONREACTIVE

## 2014-05-18 LAB — WET PREP BY MOLECULAR PROBE
CANDIDA SPECIES: NEGATIVE
GARDNERELLA VAGINALIS: NEGATIVE
TRICHOMONAS VAG: NEGATIVE

## 2014-05-18 LAB — URINALYSIS, MICROSCOPIC ONLY
CRYSTALS: NONE SEEN
Casts: NONE SEEN

## 2014-05-18 LAB — IPS N GONORRHOEA AND CHLAMYDIA BY PCR

## 2014-05-19 LAB — URINE CULTURE
COLONY COUNT: NO GROWTH
Organism ID, Bacteria: NO GROWTH

## 2014-05-20 NOTE — Progress Notes (Signed)
Encounter reviewed by Dr. Taren Dymek Silva.  

## 2014-05-22 ENCOUNTER — Telehealth: Payer: Self-pay | Admitting: *Deleted

## 2014-05-22 NOTE — Telephone Encounter (Signed)
Left Message To Call Back  

## 2014-05-22 NOTE — Telephone Encounter (Signed)
-----   Message from Lauro FranklinPatricia Rolen-Grubb, FNP sent at 05/21/2014  8:39 AM EST ----- Please let patient know that urine culture is negative.  The STD's including HIV, Hep B, ST, GC, CHL, is all negative.  The Affirm test is negative.  Still glad we treated her with Diflucan.

## 2014-05-24 NOTE — Telephone Encounter (Signed)
Left detailed message on patient's vm in regards to lab results (okay per dpr)

## 2014-07-18 ENCOUNTER — Encounter (HOSPITAL_COMMUNITY): Payer: Self-pay | Admitting: Emergency Medicine

## 2014-07-18 ENCOUNTER — Emergency Department (HOSPITAL_COMMUNITY)
Admission: EM | Admit: 2014-07-18 | Discharge: 2014-07-18 | Disposition: A | Discharge status: Home / Self Care | Payer: BLUE CROSS/BLUE SHIELD | Attending: Emergency Medicine | Admitting: Emergency Medicine

## 2014-07-18 DIAGNOSIS — Z87440 Personal history of urinary (tract) infections: Secondary | ICD-10-CM | POA: Diagnosis not present

## 2014-07-18 DIAGNOSIS — F419 Anxiety disorder, unspecified: Secondary | ICD-10-CM | POA: Diagnosis not present

## 2014-07-18 DIAGNOSIS — F410 Panic disorder [episodic paroxysmal anxiety] without agoraphobia: Secondary | ICD-10-CM

## 2014-07-18 DIAGNOSIS — Z79899 Other long term (current) drug therapy: Secondary | ICD-10-CM | POA: Insufficient documentation

## 2014-07-18 DIAGNOSIS — Z720 Tobacco use: Secondary | ICD-10-CM | POA: Insufficient documentation

## 2014-07-18 DIAGNOSIS — Z8742 Personal history of other diseases of the female genital tract: Secondary | ICD-10-CM | POA: Diagnosis not present

## 2014-07-18 DIAGNOSIS — Z8619 Personal history of other infectious and parasitic diseases: Secondary | ICD-10-CM | POA: Diagnosis not present

## 2014-07-18 DIAGNOSIS — Z8639 Personal history of other endocrine, nutritional and metabolic disease: Secondary | ICD-10-CM | POA: Insufficient documentation

## 2014-07-18 DIAGNOSIS — Z880 Allergy status to penicillin: Secondary | ICD-10-CM | POA: Diagnosis not present

## 2014-07-18 MED ORDER — LORAZEPAM 1 MG PO TABS
1.0000 mg | ORAL_TABLET | Freq: Once | ORAL | Status: AC
Start: 2014-07-18 — End: 2014-07-18
  Administered 2014-07-18: 1 mg via ORAL
  Filled 2014-07-18: qty 1

## 2014-07-18 MED ORDER — LORAZEPAM 1 MG PO TABS: 1.0000 mg | ORAL_TABLET | Freq: Three times a day (TID) | ORAL | Status: DC | PRN

## 2014-07-18 NOTE — ED Provider Notes (Signed)
CSN: [id number]     Arrival date & time 07/18/14  0158 History  This chart was scribed for Savannah Severinlga Kalenna Millett, MD by Annye AsaAnna Dorsett, ED Scribe. This patient was seen in room A10C/A10C and the patient's care was started at 4:53 AM.    Chief Complaint  Patient presents with  . Anxiety   Patient is a 24 y.o. female presenting with anxiety. The history is provided by the patient. No language interpreter was used.  Anxiety    HPI Comments: Delice BisonJessica Thielke is a 24 y.o. female who presents to the Emergency Department complaining of anxiety. Patient explains that she took 10mg  generic Lexapro 4 days PTA; she reports that she woke from sleep that night very anxious. Her anxiety continued throughout the next 4 days with associated appetite and sleep disturbances and "difficulty focusing her eyes;" she felt "dissociated" for several hours but then was too afraid to leave her apartment for the next few days. She reports that her anxiety has improved with Ativan in the ED.   She was prescribed Lexapro by her psychiatrist, whom she sees regularly, for anxiety and depression. She had never taken this medication before. She denies personal or familial history of bipolar disorder.   Past Medical History  Diagnosis Date  . PCOS (polycystic ovarian syndrome) 2007  . Dysmenorrhea   . Herpes simplex infection of eyelid   . History of recurrent UTIs    History reviewed. No pertinent past surgical history. Family History  Problem Relation Age of Onset  . Colon cancer Mother   . Hypertension Mother   . Ovarian cysts Mother   . Osteoporosis Maternal Grandmother   . Brain cancer Paternal Grandfather    History  Substance Use Topics  . Smoking status: Current Every Day Smoker -- 1.00 packs/day for 7 years    Types: Cigarettes  . Smokeless tobacco: Never Used     Comment: 1/2 pack  . Alcohol Use: 1.5 oz/week    3 Standard drinks or equivalent per week     Comment: weekends   OB History    Gravida Para Term  Preterm AB TAB SAB Ectopic Multiple Living   0              Review of Systems  Constitutional: Positive for appetite change.  Psychiatric/Behavioral: Positive for sleep disturbance. The patient is nervous/anxious.   All other systems reviewed and are negative.  Allergies  Iodine; Azithromycin; and Penicillins  Home Medications   Prior to Admission medications   Medication Sig Start Date End Date Taking? Authorizing Provider  EPINEPHrine 0.3 mg/0.3 mL IJ SOAJ injection Inject 0.3 mg into the muscle as needed (for allergic reaction).    Yes Historical Provider, MD  escitalopram (LEXAPRO) 10 MG tablet Take 10 mg by mouth daily.   Yes Historical Provider, MD  Levonorgestrel (SKYLA) 13.5 MG IUD by Intrauterine route as directed. Skyla IUD   Yes Historical Provider, MD  loratadine (CLARITIN) 10 MG tablet Take 10 mg by mouth daily.   Yes Historical Provider, MD  fluconazole (DIFLUCAN) 150 MG tablet Take 1 tablet (150 mg total) by mouth once. Take one tablet.  Repeat in 48 hours if symptoms are not completely resolved. Patient not taking: Reported on 07/18/2014 05/17/14   Roanna BanningPatricia R Grubb, FNP  nitrofurantoin, macrocrystal-monohydrate, (MACROBID) 100 MG capsule 1 po after sex Patient not taking: Reported on 07/18/2014 01/31/14   Douglass Riversracy Lathrop, MD   BP 131/90 mmHg  Pulse 130  Temp(Src) 97.4 F (36.3 C) (Oral)  Resp 24  Ht  (1.676 m)  Wt 105 lb (47.628 kg)  BMI 16.96 kg/m2  SpO2 99% Physical Exam  Constitutional: She is oriented to person, place, and time. She appears well-developed and well-nourished. No distress.  HENT:  Head: Normocephalic and atraumatic.  Mouth/Throat: Oropharynx is clear and moist. No oropharyngeal exudate.  Dry mucous membranes  Eyes: EOM are normal. Pupils are equal, round, and reactive to light.  Neck: Normal range of motion. Neck supple. No JVD present.  Cardiovascular: Normal rate, regular rhythm and normal heart sounds.  Exam reveals no gallop and no  friction rub.   No murmur heard. Pulmonary/Chest: Effort normal and breath sounds normal. No respiratory distress. She has no wheezes. She has no rales.  Abdominal: Soft. Bowel sounds are normal. She exhibits no mass. There is no tenderness. There is no rebound and no guarding.  Musculoskeletal: Normal range of motion. She exhibits no edema.  Moves all extremities normally.   Lymphadenopathy:    She has no cervical adenopathy.  Neurological: She is alert and oriented to person, place, and time. She displays normal reflexes.  Skin: Skin is warm and dry. No rash noted.  Psychiatric: She has a normal mood and affect. Her behavior is normal.  Nursing note and vitals reviewed.   ED Course  Procedures   DIAGNOSTIC STUDIES: Oxygen Saturation is 99% on RA, normal by my interpretation.    COORDINATION OF CARE: 4:56 AM Discussed treatment plan with pt at bedside and pt agreed to plan.   Labs Review Labs Reviewed - No data to display  Imaging Review No results found.   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  Patient has received Ativan here in the emergency department, reports symptoms are very much improved.  Will have her follow back up with her psychiatrist  I personally performed the services described in this documentation, which was scribed in my presence. The recorded information has been reviewed and is accurate.       Savannah Severin, MD 07/18/14 [phone]

## 2014-07-18 NOTE — Discharge Instructions (Signed)
Please follow-up with your psychiatrist to discuss your medications.  Take Ativan only as needed for severe anxiety.  Turn to the emergency department for worsening condition or new concerning symptoms.    Panic Attacks Panic attacks are sudden, short-livedsurges of severe anxiety, fear, or discomfort. They may occur for no reason when you are relaxed, when you are anxious, or when you are sleeping. Panic attacks may occur for a number of reasons:   Healthy people occasionally have panic attacks in extreme, life-threatening situations, such as war or natural disasters. Normal anxiety is a protective mechanism of the body that helps [place] react to danger (fight or flight response).  Panic attacks are often seen with anxiety disorders, such as panic disorder, social anxiety disorder, generalized anxiety disorder, and phobias. Anxiety disorders cause excessive or uncontrollable anxiety. They may interfere with your relationships or other life activities.  Panic attacks are sometimes seen with other mental illnesses, such as depression and posttraumatic stress disorder.  Certain medical conditions, prescription medicines, and drugs of abuse can cause panic attacks. SYMPTOMS  Panic attacks start suddenly, peak within 20 minutes, and are accompanied by four or more of the following symptoms:  Pounding heart or fast heart rate (palpitations).  Sweating.  Trembling or shaking.  Shortness of breath or feeling smothered.  Feeling choked.  Chest pain or discomfort.  Nausea or strange feeling in your stomach.  Dizziness, light-headedness, or feeling like you will faint.  Chills or hot flushes.  Numbness or tingling in your lips or hands and feet.  Feeling that things are not real or feeling that you are not yourself.  Fear of losing control or going crazy.  Fear of dying. Some of these symptoms can mimic serious medical conditions. For example, you may think you are having a heart attack.  Although panic attacks can be very scary, they are not life threatening. DIAGNOSIS  Panic attacks are diagnosed through an assessment by your health care provider. Your health care provider will ask questions about your symptoms, such as where and when they occurred. Your health care provider will also ask about your medical history and use of alcohol and drugs, including prescription medicines. Your health care provider may order blood tests or other studies to rule out a serious medical condition. Your health care provider may refer you to a mental health professional for further evaluation. TREATMENT   Most healthy people who have one or two panic attacks in an extreme, life-threatening situation will not require treatment.  The treatment for panic attacks associated with anxiety disorders or other mental illness typically involves counseling with a mental health professional, medicine, or a combination of both. Your health care provider will help determine what treatment is best for you.  Panic attacks due to physical illness usually go away with treatment of the illness. If prescription medicine is causing panic attacks, talk with your health care provider about stopping the medicine, decreasing the dose, or substituting another medicine.  Panic attacks due to alcohol or drug abuse go away with abstinence. Some adults need professional help in order to stop drinking or using drugs. HOME CARE INSTRUCTIONS   Take all medicines as directed by your health care provider.   Schedule and attend follow-up visits as directed by your health care provider. It is important to keep all your appointments. SEEK MEDICAL CARE IF:  You are not able to take your medicines as prescribed.  Your symptoms do not improve or get worse. SEEK IMMEDIATE MEDICAL CARE  IF:   You experience panic attack symptoms that are different than your usual symptoms.  You have serious thoughts about hurting yourself or  others.  You are taking medicine for panic attacks and have a serious side effect. MAKE SURE YOU:  Understand these instructions.  Will watch your condition.  Will get help right away if you are not doing well or get worse. Document Released: 04/20/2005 Document Revised: 04/25/2013 Document Reviewed: 12/02/2012 [facility] Patient Information 2015 [place], [place]. This information is not intended to replace advice given to you by your health care provider. Make sure you discuss any questions you have with your health care provider.

## 2014-07-18 NOTE — ED Notes (Signed)
Pt reports taking generic Lexapro on Sunday. sts she just took one and just a few hours later started feeling anxious. Pt sts she has had difficulty sleeping and gathering thoughts since then. Pt hyper in triage and tearful.

## 2014-07-18 NOTE — ED Notes (Signed)
Pt a/o x 4 on d/c. 

## 2014-07-20 ENCOUNTER — Emergency Department (HOSPITAL_COMMUNITY)
Admission: EM | Admit: 2014-07-20 | Discharge: 2014-07-20 | Disposition: A | Discharge status: Home / Self Care | Payer: BLUE CROSS/BLUE SHIELD | Attending: Emergency Medicine | Admitting: Emergency Medicine

## 2014-07-20 ENCOUNTER — Encounter (HOSPITAL_COMMUNITY): Payer: Self-pay | Admitting: Family Medicine

## 2014-07-20 DIAGNOSIS — Z792 Long term (current) use of antibiotics: Secondary | ICD-10-CM | POA: Diagnosis not present

## 2014-07-20 DIAGNOSIS — Z8619 Personal history of other infectious and parasitic diseases: Secondary | ICD-10-CM | POA: Insufficient documentation

## 2014-07-20 DIAGNOSIS — F1310 Sedative, hypnotic or anxiolytic abuse, uncomplicated: Secondary | ICD-10-CM | POA: Diagnosis not present

## 2014-07-20 DIAGNOSIS — T43215A Adverse effect of selective serotonin and norepinephrine reuptake inhibitors, initial encounter: Secondary | ICD-10-CM | POA: Diagnosis not present

## 2014-07-20 DIAGNOSIS — Z880 Allergy status to penicillin: Secondary | ICD-10-CM | POA: Insufficient documentation

## 2014-07-20 DIAGNOSIS — Z87448 Personal history of other diseases of urinary system: Secondary | ICD-10-CM | POA: Insufficient documentation

## 2014-07-20 DIAGNOSIS — F419 Anxiety disorder, unspecified: Secondary | ICD-10-CM | POA: Diagnosis not present

## 2014-07-20 DIAGNOSIS — Z720 Tobacco use: Secondary | ICD-10-CM | POA: Insufficient documentation

## 2014-07-20 DIAGNOSIS — T50905A Adverse effect of unspecified drugs, medicaments and biological substances, initial encounter: Secondary | ICD-10-CM

## 2014-07-20 DIAGNOSIS — Z8639 Personal history of other endocrine, nutritional and metabolic disease: Secondary | ICD-10-CM | POA: Diagnosis not present

## 2014-07-20 LAB — RAPID URINE DRUG SCREEN, HOSP PERFORMED
Amphetamines: NOT DETECTED
BARBITURATES: NOT DETECTED
BENZODIAZEPINES: POSITIVE — AB
COCAINE: NOT DETECTED
Opiates: NOT DETECTED
Tetrahydrocannabinol: NOT DETECTED

## 2014-07-20 LAB — CBC
HCT: 37 % (ref 36.0–46.0)
Hemoglobin: 12.8 g/dL (ref 12.0–15.0)
MCH: 31.4 pg (ref 26.0–34.0)
MCHC: 34.6 g/dL (ref 30.0–36.0)
MCV: 90.9 fL (ref 78.0–100.0)
PLATELETS: 219 10*3/uL (ref 150–400)
RBC: 4.07 MIL/uL (ref 3.87–5.11)
RDW: 11.6 % (ref 11.5–15.5)
WBC: 5.3 10*3/uL (ref 4.0–10.5)

## 2014-07-20 LAB — COMPREHENSIVE METABOLIC PANEL
ALT: 16 U/L (ref 0–35)
AST: 18 U/L (ref 0–37)
Albumin: 4 g/dL (ref 3.5–5.2)
Alkaline Phosphatase: 44 U/L (ref 39–117)
Anion gap: 5 (ref 5–15)
BILIRUBIN TOTAL: 0.9 mg/dL (ref 0.3–1.2)
BUN: 9 mg/dL (ref 6–23)
CHLORIDE: 106 mmol/L (ref 96–112)
CO2: 26 mmol/L (ref 19–32)
Calcium: 9.3 mg/dL (ref 8.4–10.5)
Creatinine, Ser: 0.64 mg/dL (ref 0.50–1.10)
GFR calc Af Amer: >90 mL/min (ref >90)
GFR calc non Af Amer: >90 mL/min (ref >90)
Glucose, Bld: 120 mg/dL — ABNORMAL HIGH (ref 70–99)
POTASSIUM: 3.7 mmol/L (ref 3.5–5.1)
SODIUM: 137 mmol/L (ref 135–145)
Total Protein: 6.3 g/dL (ref 6.0–8.3)

## 2014-07-20 LAB — ACETAMINOPHEN LEVEL: Acetaminophen (Tylenol), Serum: <10 ug/mL — ABNORMAL LOW (ref 10–30)

## 2014-07-20 LAB — ETHANOL

## 2014-07-20 LAB — SALICYLATE LEVEL: Salicylate Lvl: <4 mg/dL (ref 2.8–20.0)

## 2014-07-20 MED ORDER — ONDANSETRON HCL 4 MG PO TABS: 4.0000 mg | ORAL_TABLET | Freq: Four times a day (QID) | ORAL | Status: DC

## 2014-07-20 NOTE — ED Provider Notes (Signed)
CSN: [id number]     Arrival date & time 07/20/14  1420 History   First MD Initiated Contact with Patient 07/20/14 1756     Chief Complaint  Patient presents with  . Anxiety  . Anorexia     (Consider location/radiation/quality/duration/timing/severity/associated sxs/prior Treatment) HPI Comments: Patient presents to the emergency department with chief complaint of anxiety, and anorexia. She states that she was started on Lexapro last week, which she recently discontinued. She states that she has been feeling anxious, not eating, not sleeping, and has been having emotional outbursts. She states this is unusual for her. She states that she has not experienced anything like this before. She states that she is able to eat and drink if she forces herself, but sometimes feels nauseated. She denies any suicidal or homicidal ideations. Denies any auditory or visual hallucinations. She denies any pain. There are no aggravating or alleviating factors.  The history is provided by the patient. No language interpreter was used.    Past Medical History  Diagnosis Date  . PCOS (polycystic ovarian syndrome) 2007  . Dysmenorrhea   . Herpes simplex infection of eyelid   . History of recurrent UTIs    History reviewed. No pertinent past surgical history. Family History  Problem Relation Age of Onset  . Colon cancer Mother   . Hypertension Mother   . Ovarian cysts Mother   . Osteoporosis Maternal Grandmother   . Brain cancer Paternal Grandfather    History  Substance Use Topics  . Smoking status: Current Every Day Smoker -- 1.00 packs/day for 7 years    Types: Cigarettes  . Smokeless tobacco: Never Used     Comment: 1/2 pack  . Alcohol Use: 1.5 oz/week    3 Standard drinks or equivalent per week     Comment: weekends   OB History    Gravida Para Term Preterm AB TAB SAB Ectopic Multiple Living   0              Review of Systems  Constitutional: Negative for fever and chills.  Respiratory:  Negative for shortness of breath.   Cardiovascular: Negative for chest pain.  Gastrointestinal: Negative for nausea, vomiting, diarrhea and constipation.  Genitourinary: Negative for dysuria.  Psychiatric/Behavioral: Positive for behavioral problems and sleep disturbance. The patient is nervous/anxious.   All other systems reviewed and are negative.     Allergies  Iodine; Other; Azithromycin; and Penicillins  Home Medications   Prior to Admission medications   Medication Sig Start Date End Date Taking? Authorizing Provider  EPINEPHrine 0.3 mg/0.3 mL IJ SOAJ injection Inject 0.3 mg into the muscle as needed (for allergic reaction).    Yes Historical Provider, MD  Levonorgestrel (SKYLA) 13.5 MG IUD by Intrauterine route as directed. Skyla IUD   Yes Historical Provider, MD  loratadine (CLARITIN) 10 MG tablet Take 10 mg by mouth daily.   Yes Historical Provider, MD  LORazepam (ATIVAN) 1 MG tablet Take 1 tablet (1 mg total) by mouth every 8 (eight) hours as needed for anxiety. 07/18/14  Yes Marisa Severin, MD  fluconazole (DIFLUCAN) 150 MG tablet Take 1 tablet (150 mg total) by mouth once. Take one tablet.  Repeat in 48 hours if symptoms are not completely resolved. Patient not taking: Reported on 07/18/2014 05/17/14   Roanna Banning, FNP  nitrofurantoin, macrocrystal-monohydrate, (MACROBID) 100 MG capsule 1 po after sex Patient not taking: Reported on 07/18/2014 01/31/14   Douglass Rivers, MD   BP 115/65 mmHg  Pulse 84  Temp(Src) 97.9 F (36.6 C)  Resp 18  SpO2 99% Physical Exam  Constitutional: She is oriented to person, place, and time. She appears well-developed and well-nourished.  Thin  HENT:  Head: Normocephalic and atraumatic.  Eyes: Conjunctivae and EOM are normal. Pupils are equal, round, and reactive to light.  Neck: Normal range of motion. Neck supple.  Cardiovascular: Normal rate and regular rhythm.  Exam reveals no gallop and no friction rub.   No murmur  heard. Pulmonary/Chest: Effort normal and breath sounds normal. No respiratory distress. She has no wheezes. She has no rales. She exhibits no tenderness.  Abdominal: Soft. Bowel sounds are normal. She exhibits no distension and no mass. There is no tenderness. There is no rebound and no guarding.  Musculoskeletal: Normal range of motion. She exhibits no edema or tenderness.  Neurological: She is alert and oriented to person, place, and time.  Skin: Skin is warm and dry.  Psychiatric: She has a normal mood and affect. Her behavior is normal. Judgment and thought content normal.  Nursing note and vitals reviewed.   ED Course  Procedures (including critical care time) Results for orders placed or performed during the hospital encounter of 07/20/14  Acetaminophen level  Result Value Ref Range   Acetaminophen (Tylenol), Serum <10.0 (L) 10 - 30 ug/mL  CBC  Result Value Ref Range   WBC 5.3 4.0 - 10.5 K/uL   RBC 4.07 3.87 - 5.11 MIL/uL   Hemoglobin 12.8 12.0 - 15.0 g/dL   HCT 16.1 09.6 - 04.5 %   MCV 90.9 78.0 - 100.0 fL   MCH 31.4 26.0 - 34.0 pg   MCHC 34.6 30.0 - 36.0 g/dL   RDW 40.9 81.1 - 91.4 %   Platelets 219 150 - 400 K/uL  Comprehensive metabolic panel  Result Value Ref Range   Sodium 137 135 - 145 mmol/L   Potassium 3.7 3.5 - 5.1 mmol/L   Chloride 106 96 - 112 mmol/L   CO2 26 19 - 32 mmol/L   Glucose, Bld 120 (H) 70 - 99 mg/dL   BUN 9 6 - 23 mg/dL   Creatinine, Ser 7.82 0.50 - 1.10 mg/dL   Calcium 9.3 8.4 - 95.6 mg/dL   Total Protein 6.3 6.0 - 8.3 g/dL   Albumin 4.0 3.5 - 5.2 g/dL   AST 18 0 - 37 U/L   ALT 16 0 - 35 U/L   Alkaline Phosphatase 44 39 - 117 U/L   Total Bilirubin 0.9 0.3 - 1.2 mg/dL   GFR calc non Af Amer >90 >90 mL/min   GFR calc Af Amer >90 >90 mL/min   Anion gap 5 5 - 15  Ethanol (ETOH)  Result Value Ref Range   Alcohol, Ethyl (B) <5 0 - 9 mg/dL  Salicylate level  Result Value Ref Range   Salicylate Lvl <4.0 2.8 - 20.0 mg/dL   No results  found.   Imaging Review No results found.   EKG Interpretation None      MDM   Final diagnoses:  Anxiety  Medication reaction, initial encounter    Patient with symptoms consistent with anxiety/depression. Possibly anorexia. Patient states that she has not been eating, drinking, or sleeping well for the past several days. She was recently started on Lexapro, but discontinued this. She does not have any SI or HI. She is brought to the emergency department by her mother who is concerned about dehydration. Patient's labs are reassuring. She looks well. She is not a danger  to herself or others. At this time, shared decision-making with the patient was used and a plan was formed for outpatient therapy. Patient will be given outpatient behavioral health resources. I will also give her prescription of Zofran to help with her nausea. She has not had any abdominal pain or other symptoms. She is stable and ready for discharge.    Roxy Horsemanobert Timara Loma, PA-C 07/20/14 1839  Rolland PorterMark James, MD 07/21/14 [phone]

## 2014-07-20 NOTE — ED Notes (Signed)
Called pt twice to reassess vital signs with no answer

## 2014-07-20 NOTE — ED Notes (Signed)
Signature pad not functioning.  Pt verbalized understanding.  All questions answered.

## 2014-07-20 NOTE — Discharge Instructions (Signed)
Generalized Anxiety Disorder Generalized anxiety disorder (GAD) is a mental disorder. It interferes with life functions, including relationships, work, and school. GAD is different from normal anxiety, which everyone experiences at some point in their lives in response to specific life events and activities. Normal anxiety actually helps us prepare for and get through these life events and activities. Normal anxiety goes away after the event or activity is over.  GAD causes anxiety that is not necessarily related to specific events or activities. It also causes excess anxiety in proportion to specific events or activities. The anxiety associated with GAD is also difficult to control. GAD can vary from mild to severe. People with severe GAD can have intense waves of anxiety with physical symptoms (panic attacks).  SYMPTOMS The anxiety and worry associated with GAD are difficult to control. This anxiety and worry are related to many life events and activities and also occur more days than not for 6 months or longer. People with GAD also have three or more of the following symptoms (one or more in children):  Restlessness.   Fatigue.  Difficulty concentrating.   Irritability.  Muscle tension.  Difficulty sleeping or unsatisfying sleep. DIAGNOSIS GAD is diagnosed through an assessment by your health care provider. Your health care provider will ask you questions aboutyour mood,physical symptoms, and events in your life. Your health care provider may ask you about your medical history and use of alcohol or drugs, including prescription medicines. Your health care provider may also do a physical exam and blood tests. Certain medical conditions and the use of certain substances can cause symptoms similar to those associated with GAD. Your health care provider may refer you to a mental health specialist for further evaluation. TREATMENT The following therapies are usually used to treat GAD:    Medication. Antidepressant medication usually is prescribed for long-term daily control. Antianxiety medicines may be added in severe cases, especially when panic attacks occur.   Talk therapy (psychotherapy). Certain types of talk therapy can be helpful in treating GAD by providing support, education, and guidance. A form of talk therapy called cognitive behavioral therapy can teach you healthy ways to think about and react to daily life events and activities.  Stress managementtechniques. These include yoga, meditation, and exercise and can be very helpful when they are practiced regularly. A mental health specialist can help determine which treatment is best for you. Some people see improvement with one therapy. However, other people require a combination of therapies. Document Released: 08/15/2012 Document Revised: 09/04/2013 Document Reviewed: 08/15/2012 ExitCare Patient Information 2015 [employer]. This information is not intended to replace advice given to you by your health care provider. Make sure you discuss any questions you have with your health care provider.  

## 2014-07-20 NOTE — ED Notes (Addendum)
Pt sts recently started on lexapro and since has been really anxious, not eating, sleeping. sts was seen here and given ativan and has been taking it the last 3 nights and helping a little. sts she hasn't been drinking or eating anything and feels lightheaded. Per mom pt has been having crying outbursts.

## 2014-07-22 IMAGING — CR DG CHEST 2V
2 series · 2 of 2 positions shown · non-contrast
Comparison: None.

CLINICAL DATA: Cough, history of asthma, smoking history

EXAM:
CHEST  2 VIEW

[view not recorded (1 of 2)]
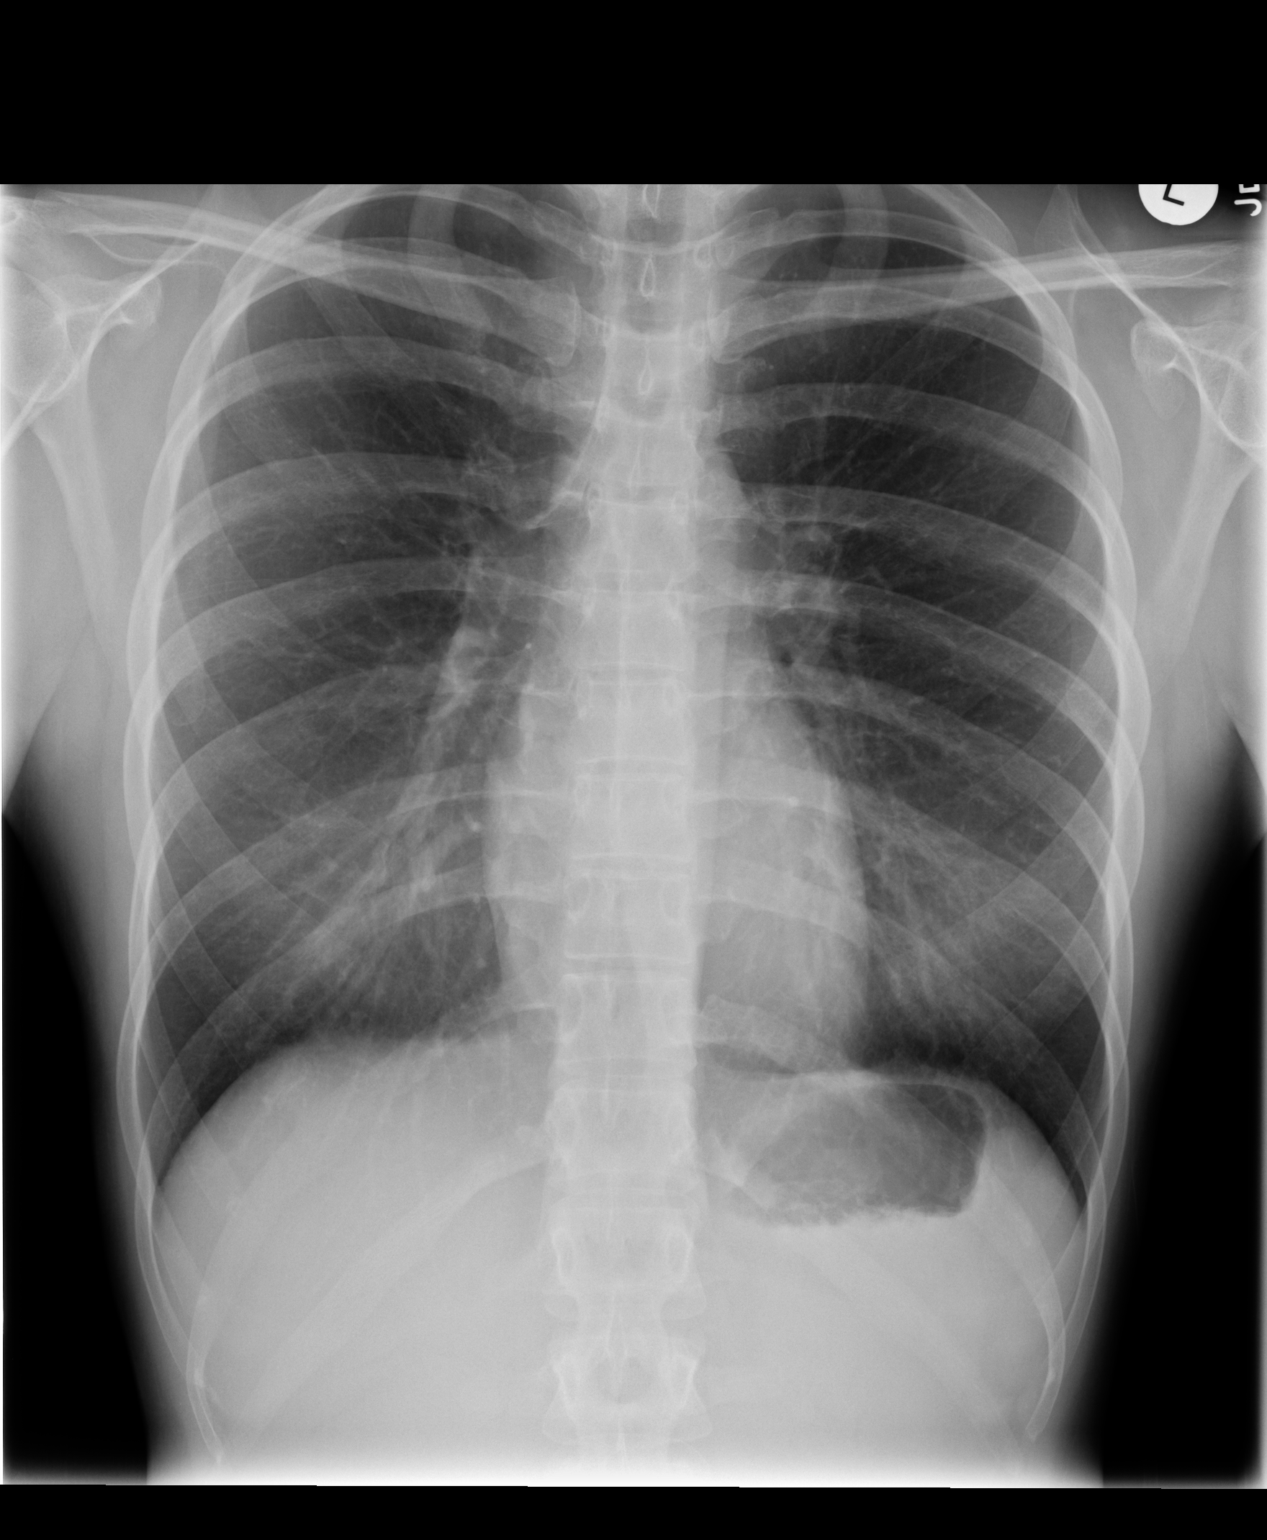

[view not recorded (2 of 2)]
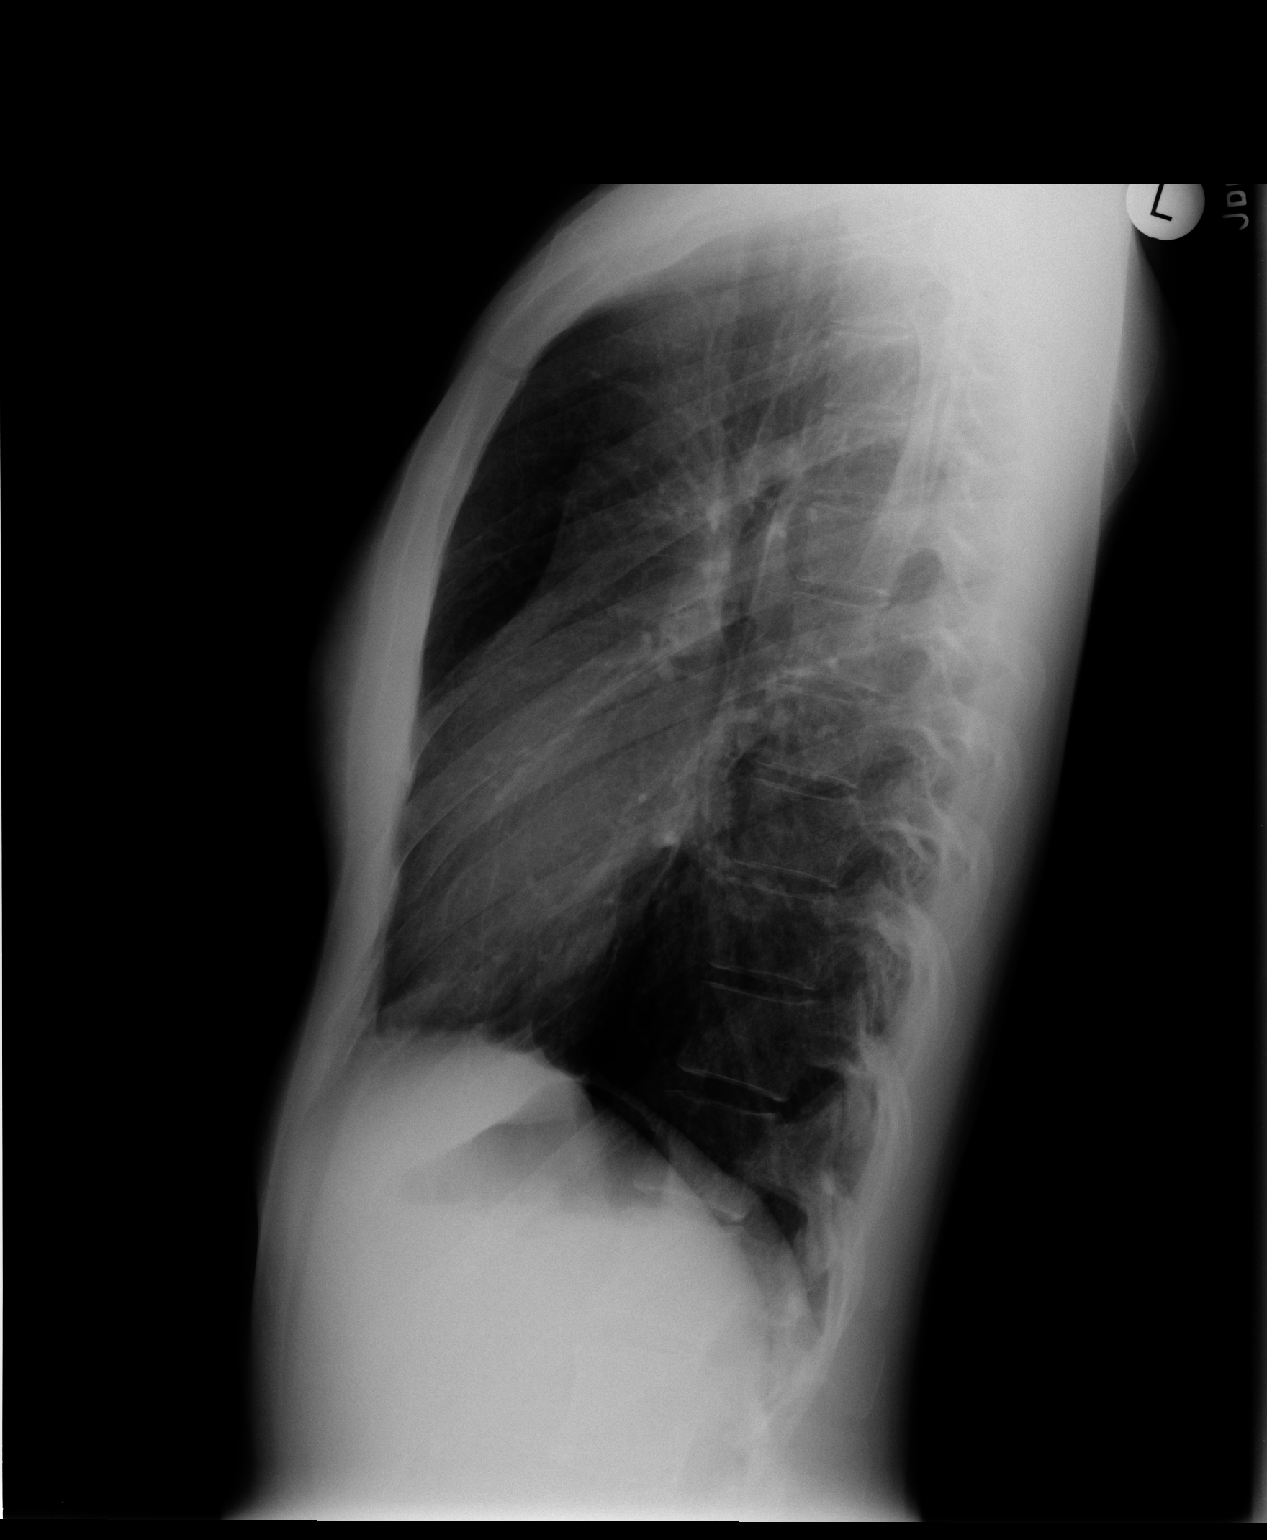

[2 of 2 positions shown; findings below may reference images not displayed]

FINDINGS: The lungs are somewhat hyperaerated but no active infiltrate or
effusion is seen. Mediastinal contours appear normal. The heart is
within normal limits in size. No bony abnormality is seen.
IMPRESSION: Hyper aeration. No active lung disease.

## 2014-08-14 ENCOUNTER — Ambulatory Visit (HOSPITAL_COMMUNITY)
Admission: RE | Admit: 2014-08-14 | Discharge: 2014-08-14 | Disposition: A | Discharge status: Home / Self Care | Payer: BLUE CROSS/BLUE SHIELD | Attending: Psychiatry | Admitting: Psychiatry

## 2014-08-14 ENCOUNTER — Encounter (HOSPITAL_COMMUNITY): Payer: Self-pay

## 2014-08-14 ENCOUNTER — Emergency Department (HOSPITAL_COMMUNITY)
Admission: EM | Admit: 2014-08-14 | Discharge: 2014-08-14 | Disposition: A | Discharge status: Home / Self Care | Payer: BLUE CROSS/BLUE SHIELD | Attending: Emergency Medicine | Admitting: Emergency Medicine

## 2014-08-14 ENCOUNTER — Encounter (HOSPITAL_COMMUNITY): Payer: Self-pay | Admitting: *Deleted

## 2014-08-14 DIAGNOSIS — Z880 Allergy status to penicillin: Secondary | ICD-10-CM | POA: Insufficient documentation

## 2014-08-14 DIAGNOSIS — Z8742 Personal history of other diseases of the female genital tract: Secondary | ICD-10-CM | POA: Insufficient documentation

## 2014-08-14 DIAGNOSIS — F419 Anxiety disorder, unspecified: Secondary | ICD-10-CM | POA: Diagnosis not present

## 2014-08-14 DIAGNOSIS — Z79899 Other long term (current) drug therapy: Secondary | ICD-10-CM | POA: Diagnosis not present

## 2014-08-14 DIAGNOSIS — F332 Major depressive disorder, recurrent severe without psychotic features: Secondary | ICD-10-CM | POA: Diagnosis not present

## 2014-08-14 DIAGNOSIS — Z87440 Personal history of urinary (tract) infections: Secondary | ICD-10-CM | POA: Diagnosis not present

## 2014-08-14 DIAGNOSIS — Z8639 Personal history of other endocrine, nutritional and metabolic disease: Secondary | ICD-10-CM | POA: Insufficient documentation

## 2014-08-14 DIAGNOSIS — Z883 Allergy status to other anti-infective agents status: Secondary | ICD-10-CM | POA: Diagnosis not present

## 2014-08-14 DIAGNOSIS — F17210 Nicotine dependence, cigarettes, uncomplicated: Secondary | ICD-10-CM | POA: Insufficient documentation

## 2014-08-14 DIAGNOSIS — F410 Panic disorder [episodic paroxysmal anxiety] without agoraphobia: Secondary | ICD-10-CM | POA: Insufficient documentation

## 2014-08-14 DIAGNOSIS — Z881 Allergy status to other antibiotic agents status: Secondary | ICD-10-CM | POA: Insufficient documentation

## 2014-08-14 DIAGNOSIS — Z8619 Personal history of other infectious and parasitic diseases: Secondary | ICD-10-CM | POA: Diagnosis not present

## 2014-08-14 DIAGNOSIS — Z720 Tobacco use: Secondary | ICD-10-CM | POA: Diagnosis not present

## 2014-08-14 HISTORY — DX: Major depressive disorder, single episode, unspecified: F32.9

## 2014-08-14 HISTORY — DX: Anxiety disorder, unspecified: F41.9

## 2014-08-14 HISTORY — DX: Depression, unspecified: F32.A

## 2014-08-14 NOTE — ED Provider Notes (Signed)
CSN: [id number]     Arrival date & time 08/14/14  1754 History  This chart was scribed for non-physician practitioner, Junius Finner, PA-C,working with Blake Divine, MD, by Karle Plumber, ED Scribe. This patient was seen in room WTR8/WTR8 and the patient's care was started at 6:53 PM.  Chief Complaint  Patient presents with  . Anxiety   Patient is a 24 y.o. female presenting with anxiety. The history is provided by the patient and medical records. No language interpreter was used.  Anxiety Pertinent negatives include no chest pain and no shortness of breath.    HPI Comments:  Savannah Patel is a 24 y.o. female with PMHx of anxiety and panic attacks brought in by EMS, who presents to the Emergency Department complaining of increased anxiety for the past four days. Pt reports associated insomnia. She states she contacted the counselor she normally sees and was instructed to contact Behavioral Health. She was assessed there and was sent here to be evaluated and she states she does not know the reasoning behind this. Pt states she was prescribed Lexapro 10 mg approximately one month ago but has a "very bad reaction" so she discontinued use. She reported to [facility] ED and was prescribed Ativan 1 mg for treatment of her anxiety but has since been told that it is highly addictive so she decreased the dose to half of a tablet but reports it is not enough to control her symptoms. She states she last took half a tablet earlier today with no significant relief of the symptoms. She denies modifying factors of her symptoms. Denies HI, SI, CP or SOB. PMHx of PCOS, dysmenorrhea, herpes simplex of the eye and depression.  Past Medical History  Diagnosis Date  . PCOS (polycystic ovarian syndrome) 2007  . Dysmenorrhea   . Herpes simplex infection of eyelid   . History of recurrent UTIs   . Depression   . Anxiety    History reviewed. No pertinent past surgical history. Family History  Problem Relation Age of  Onset  . Colon cancer Mother   . Hypertension Mother   . Ovarian cysts Mother   . Osteoporosis Maternal Grandmother   . Brain cancer Paternal Grandfather    History  Substance Use Topics  . Smoking status: Current Every Day Smoker -- 1.00 packs/day for 7 years    Types: Cigarettes  . Smokeless tobacco: Never Used     Comment: 1/2 pack  . Alcohol Use: No   OB History    Gravida Para Term Preterm AB TAB SAB Ectopic Multiple Living   0              Review of Systems  Respiratory: Negative for shortness of breath.   Cardiovascular: Negative for chest pain.  Gastrointestinal: Negative for nausea and vomiting.  Psychiatric/Behavioral: Positive for sleep disturbance. Negative for suicidal ideas. The patient is nervous/anxious.     Allergies  Iodine; Other; Azithromycin; and Penicillins  Home Medications   Prior to Admission medications   Medication Sig Start Date End Date Taking? Authorizing Provider  EPINEPHrine 0.3 mg/0.3 mL IJ SOAJ injection Inject 0.3 mg into the muscle as needed (for allergic reaction).     Historical Provider, MD  fluconazole (DIFLUCAN) 150 MG tablet Take 1 tablet (150 mg total) by mouth once. Take one tablet.  Repeat in 48 hours if symptoms are not completely resolved. Patient not taking: Reported on 07/18/2014 05/17/14   Roanna Banning, FNP  Levonorgestrel (SKYLA) 13.5 MG IUD by Intrauterine  route as directed. Skyla IUD    Historical Provider, MD  loratadine (CLARITIN) 10 MG tablet Take 10 mg by mouth daily.    Historical Provider, MD  LORazepam (ATIVAN) 1 MG tablet Take 1 tablet (1 mg total) by mouth every 8 (eight) hours as needed for anxiety. 07/18/14   Marisa Severinlga Otter, MD  nitrofurantoin, macrocrystal-monohydrate, (MACROBID) 100 MG capsule 1 po after sex Patient not taking: Reported on 07/18/2014 01/31/14   Douglass Riversracy Lathrop, MD  ondansetron (ZOFRAN) 4 MG tablet Take 1 tablet (4 mg total) by mouth every 6 (six) hours. 07/20/14   Roxy Horsemanobert Browning, PA-C   Triage  Vitals: BP 120/74 mmHg  Pulse 80  Temp(Src) 98 F (36.7 C) (Oral)  Resp 16  SpO2 100% Physical Exam  Constitutional: She is oriented to person, place, and time. She appears well-developed and well-nourished.  HENT:  Head: Normocephalic and atraumatic.  Eyes: EOM are normal.  Neck: Normal range of motion.  Cardiovascular: Normal rate.   Pulmonary/Chest: Effort normal.  Musculoskeletal: Normal range of motion.  Neurological: She is alert and oriented to person, place, and time.  Skin: Skin is warm and dry.  Psychiatric: She has a normal mood and affect. Her speech is normal and behavior is normal. Thought content normal. She expresses no homicidal and no suicidal ideation.  Nursing note and vitals reviewed.   ED Course  Procedures (including critical care time) DIAGNOSTIC STUDIES: Oxygen Saturation is 100% on RA, normal by my interpretation.   COORDINATION OF CARE: 6:58 PM- Will contact [facility] to see the indication of transporting pt here. Pt verbalizes understanding and agrees to plan.  Medications - No data to display  Labs Review Labs Reviewed - No data to display  Imaging Review No results found.   EKG Interpretation None      MDM   Final diagnoses:  Anxiety    Pt is a 23yo female [facility] Pt [facility] well, NAD. Vitals: WNL.  Pt medically cleared. Consulted with [facility] and [facility], recommend Intensive outpatient treatment. Resource guides provided. Pt denies SI or HI. Pt safe for discharge home as she is not a danger to herself or others.   I personally performed the services described in this documentation, which was scribed in my presence. The recorded information has been reviewed and is accurate.    Junius Finnerrin O'Malley, PA-C 08/14/14 1951  Bethann BerkshireJoseph Zammit, MD 08/15/14 [phone]

## 2014-08-14 NOTE — Discharge Instructions (Signed)
Panic Attacks Panic attacks are sudden, short feelings of great fear or discomfort. You may have them for no reason when you are relaxed, when you are uneasy (anxious), or when you are sleeping.  HOME CARE  Take all your medicines as told.  Check with your doctor before starting new medicines.  Keep all doctor visits. GET HELP IF:  You are not able to take your medicines as told.  Your symptoms do not get better.  Your symptoms get worse. GET HELP RIGHT AWAY IF:  Your attacks seem different than your normal attacks.  You have thoughts about hurting yourself or others.  You take panic attack medicine and you have a side effect. MAKE SURE YOU:  Understand these instructions.  Will watch your condition.  Will get help right away if you are not doing well or get worse. Document Released: 05/23/2010 Document Revised: 02/08/2013 Document Reviewed: 12/02/2012 [facility] Patient Information 2015 [place]. This information is not intended to replace advice given to you by your health care provider. Make sure you discuss any questions you have with your health care provider.  Social Anxiety Disorder Social anxiety disorder, previously called social phobia, is a mental disorder. People with social anxiety disorder frequently feel nervous, afraid, or embarrassed when around other people in social situations. They constantly worry that other people are judging or criticizing them for how they look, what they say, or how they act. They may worry that other people might reject them because of their appearance or behavior. Social anxiety disorder is more than just occasional shyness or self-consciousness. It can cause severe emotional distress. It can interfere with daily life activities. Social anxiety disorder also may lead to excessive alcohol or drug use and even suicide.  Social anxiety disorder is actually one of the most common mental disorders. It can develop at any time but usually  starts in the teenage years. Women are more commonly affected than men. Social anxiety disorder is also more common in people who have family members with anxiety disorders. It also is more common in people who have physical deformities or conditions with characteristics that are obvious to others, such as stuttered speech or movement abnormalities (Parkinson disease).  SYMPTOMS  In addition to feeling anxious or fearful in social situations, people with social anxiety disorder frequently have physical symptoms. Examples include:  Red face (blushing).  Racing heart.  Sweating.  Shaky hands or voice.  Confusion.  Light-headedness.  Upset stomach and diarrhea. DIAGNOSIS  Social anxiety disorder is diagnosed through an assessment by your health care provider. Your health care provider will ask you questions about your mood, thoughts, and reactions in social situations. Your health care provider may ask you about your medical history and use of alcohol or drugs, including prescription medicines. Certain medical conditions and the use of certain substances, including caffeine, can cause symptoms similar to social anxiety disorder. Your health care provider may refer you to a mental health specialist for further evaluation or treatment. The criteria for diagnosis of social anxiety disorder are:  Marked fear or anxiety in one or more social situations in which you may be closely watched or studied by others. Examples of such situations include:  Interacting socially (having a conversation with others, going to a party, or meeting strangers).  Being observed (eating or drinking in public or being called on in class).  Performing in front of others (giving a speech).  The social situations of concern almost always cause fear or anxiety, not  just occasionally.  People with social anxiety disorder fear that they will be viewed negatively in a way that will be embarrassing, will lead to rejection,  or will offend others. This fear is out of proportion to the actual threat posed by the social situation.  Often the triggering social situations are avoided, or they are endured with intense fear or anxiety. The fear, anxiety, or avoidance is persistent and lasts for 6 months or longer.  The anxiety causes difficulty functioning in at least some parts of your daily life. TREATMENT  Several types of treatment are available for social anxiety disorder. These treatments are often used in combination and include:   Talk therapy. Group talk therapy allows you to see that you are not alone with these problems. Individual talk therapy helps you address your specific anxiety issues with a caring professional. The most effective forms of talk therapy for social anxiety disorder are cognitive-behavioral therapy and exposure therapy. Cognitive-behavioral therapy helps you to identify and change negative thoughts and beliefs that are at the root of the disorder. Exposure therapy allows you to gradually face the situations that you fear most.  Relaxation and coping techniques. These include deep breathing, self-talk, meditation, visual imagery, and yoga. Relaxation techniques help to keep you calm in social situations.  Social Optician, dispensing.Social skills can be learned on your own or with the help of a talk therapist. They can help you feel more confident and comfortable in social situations.  Medicine. For anxiety limited to performance situations (performance anxiety), medicine called beta blockers can help by reducing or preventing the physical symptoms of social anxiety disorder. For more persistent and generalized social anxiety, antidepressant medicine may be prescribed to help control symptoms. In severe cases of social anxiety disorder, strong antianxiety medicine, called benzodiazepines, may be prescribed on a limited basis and for a short time. Document Released: 03/19/2005 Document Revised:  09/04/2013 Document Reviewed: 07/19/2012 [facility] Patient Information 2015 [place], [place]. This information is not intended to replace advice given to you by your health care provider. Make sure you discuss any questions you have with your health care provider.

## 2014-08-14 NOTE — ED Notes (Signed)
[facility]  Pt stated she couldn't breath.  Pt had just gotten there.  Pt was hyperventilating.  Pt stated hands tingling.  Hx of depression. Denies si/hi.  Vitals: 128/84, hr 110, resp 18

## 2014-08-14 NOTE — ED Notes (Signed)
Pt states not sleeping well for past 4 days. Has hx of panic attacks but they do not normally last this long. Does not take anxiety meds
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
## 2014-08-14 NOTE — BH Assessment (Signed)
Informed by Casimer LaniusKristen Butler, TTS counselor that per Dr. Lolly MustacheArfeen pt meets IOP or PHP criteria and can be discharged with resources once medically cleared.   Informed Junius Finnerrin O'Malley PA-C and she is in agreement.   Provided pt with information and education on how to access services.   Clista BernhardtNancy Seleste Tallman, [facility] Triage Specialist 08/14/2014 7:29 PM

## 2014-08-16 ENCOUNTER — Other Ambulatory Visit (HOSPITAL_COMMUNITY): Payer: BLUE CROSS/BLUE SHIELD | Attending: Psychiatry | Admitting: Licensed Clinical Social Worker

## 2014-08-16 ENCOUNTER — Other Ambulatory Visit: Payer: Self-pay | Admitting: Psychiatry

## 2014-08-16 ENCOUNTER — Encounter (HOSPITAL_COMMUNITY): Payer: Self-pay | Admitting: Licensed Clinical Social Worker

## 2014-08-16 DIAGNOSIS — F332 Major depressive disorder, recurrent severe without psychotic features: Secondary | ICD-10-CM | POA: Diagnosis not present

## 2014-08-16 DIAGNOSIS — F17210 Nicotine dependence, cigarettes, uncomplicated: Secondary | ICD-10-CM | POA: Diagnosis not present

## 2014-08-16 DIAGNOSIS — F4310 Post-traumatic stress disorder, unspecified: Secondary | ICD-10-CM | POA: Insufficient documentation

## 2014-08-16 DIAGNOSIS — F410 Panic disorder [episodic paroxysmal anxiety] without agoraphobia: Secondary | ICD-10-CM | POA: Insufficient documentation

## 2014-08-16 MED ORDER — LORAZEPAM 1 MG PO TABS: 1.0000 mg | ORAL_TABLET | Freq: Three times a day (TID) | ORAL | Status: DC | PRN
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
## 2014-08-20 ENCOUNTER — Other Ambulatory Visit (HOSPITAL_COMMUNITY): Payer: BLUE CROSS/BLUE SHIELD | Admitting: Licensed Clinical Social Worker

## 2014-08-20 DIAGNOSIS — F332 Major depressive disorder, recurrent severe without psychotic features: Secondary | ICD-10-CM

## 2014-08-20 DIAGNOSIS — F4310 Post-traumatic stress disorder, unspecified: Secondary | ICD-10-CM

## 2014-08-20 DIAGNOSIS — F410 Panic disorder [episodic paroxysmal anxiety] without agoraphobia: Secondary | ICD-10-CM

## 2014-08-20 NOTE — Psych (Signed)
[facility] PHP THERAPIST PROGRESS NOTE  Savannah Patel [id number]  Date:  08/20/2014 Time:  11:00 AM -12:30 PM  Group Topic/Focus:  Making Healthy Choices:   The focus of this group is to help patients identify negative/unhealthy choices they were using prior to admission and identify positive/healthier coping strategies to replace them upon discharge.  Participation Level:  Minimal  Participation Quality:  ParticipationThroughActivelistening  Affect:  Anxious, Depressed and Labile  Cognitive:  Alert and Oriented  Insight: Limited  Engagement in Group:  Engagedbutquiet  Modes of Intervention:  Discussion, Exploration, Problem-solving and Support  Additional Comments: Group topic for today were strategies to manage unhealthy behaviors such as being impulsive and stressors. The group began with a check in. Therapist prompted Savannah Patel to check in with the group as this was her first day. She resisted this and asked to pass. She appeared very anxious. She showed engagement through body language such as eye contact and active listening. Patient discussed that it helped to decrease amount of stressors until they were able to stabilize. Savannah Patel expressed resistance to this and then explained that she has withdrawn twice before from school and does not want to do this again. Group members were supportive of this as other group members could relate. We discussed that it helps to vent and Savannah Patel said she has not been able to talk to anyone about what is going on except her Mom recently. The group supporting her in understanding that she should not share what she was not comfortable with and the group could be helpful for her in this way. The group suggested journaling as a strategy to vent. I think that the group environment can be supportive of Savannah Patel as she can identify and get suggestions from other group members of effective coping strategies.   Suicidal/Homicidal: No  Plan:1.Therapist work on  establishing a therapeutic relationship so Savannah Patel will become more comfortable in group and will be able to open up more easily. 2.Savannah Patel gain insight about healthy coping skills so she is able to apply to life situations.   [facility] PHP THERAPIST PROGRESS NOTE  Date:  08/20/2014 Time:  1:00 PM -2:00 PM  Group Topic/Focus:  Managing Feelings:   The focus of this group is to identify what feelings patients have difficulty handling and develop a plan to handle them in a healthier way upon discharge.  Participation Level:  Active  Participation Quality:  Sharing  Affect:  Anxious and Depressed  Cognitive:  Alert  Insight: Lacking  Engagement in Group:  Limited  Modes of Intervention:  Activity, Discussion, Education, Problem-solving and Support  Additional Comments: The group topic was on managing anxiety. An application called Savannah Patel was presented to group with a variety of coping strategies to manage emotions. The group was led through a mindfulness exercise. Therapist explained the DBT concept of detachment and observation of feelings to help with recognition of both thoughts and emotions to have better management of them. We discussed setting a couple of goals for the day and then working on them.The application also showed a way to relate nutrition and exercise to improvement of symptoms. There was a place to write down thoughts and feelings and to identity cognitive distortions and to replace the distortions with a positive thought. The application also helped to identity emotions and also to relate it to specific events in the day for patient so see the connection. It also helped for patients to be more in touch with how they were feeling  so they could better manage their feeling.Savannah BumpsJessica shared more about her anxiety. She shared that it was constant with emotional lability with periods of uncontrollable outbursts of crying. She has physical symptoms of anxiety that get worse as she has a  full blown panic attack. She said that it started a week ago where she felt sped up but does not know the source. She described feeling hopeless and suicidal over the weekend. She had a plan of hanging self but not intent to act on plan. She said that this was over the weekend and no longer had these suicidal thoughts. Therapist suggested that there was underlying issues being suppressed that are coming out now and suggested she try come of strategies suggested in group that would help her relax without requiring too much of her.  She was able to see psychiatrist today and therapist checked in with her after she had seen the psychiatrist and also finished group. She said that she felt that the group was helpful and symptoms improved. She confirmed that she no longer had suicidal thoughts.  Therapist wrote a letter addressed to whom it may concern for math and computer teacher at school to convey that she is the program and take this in consideration when considering deadlines and academic loads.   Suicidal/Homicidal: No  Plan: 1.[name] continue to learn and implement strategies to manage her anxiety.2.Therapist apply supportive interventions.   Diagnosis: Primary Diagnosis: Panic disorder without agoraphobia [F41.0]    1. Panic disorder without agoraphobia   2. Major depressive disorder, recurrent, severe without psychotic features   3. PTSD (post-traumatic stress disorder)       Savannah Patel A 08/20/2014

## 2014-08-21 ENCOUNTER — Ambulatory Visit (HOSPITAL_COMMUNITY): Payer: Self-pay | Admitting: Licensed Clinical Social Worker

## 2014-08-21 ENCOUNTER — Other Ambulatory Visit (HOSPITAL_COMMUNITY): Payer: BLUE CROSS/BLUE SHIELD | Admitting: Licensed Clinical Social Worker

## 2014-08-21 DIAGNOSIS — F332 Major depressive disorder, recurrent severe without psychotic features: Secondary | ICD-10-CM

## 2014-08-21 DIAGNOSIS — F410 Panic disorder [episodic paroxysmal anxiety] without agoraphobia: Secondary | ICD-10-CM

## 2014-08-21 DIAGNOSIS — F4310 Post-traumatic stress disorder, unspecified: Secondary | ICD-10-CM
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
## 2014-08-21 NOTE — Psych (Signed)
   [facility]  Savannah BisonJessica Patel [id number]   Date:  08/20/14 Time:  2:00pm  Group Topic/Focus: Building Self Esteem:   The Focus of this group is helping patients become aware of the effects of self-esteem on their lives, the things they and others do that enhance or undermine their self-esteem, seeing the relationship between their level of self-esteem and the choices they make and learning ways to enhance self-esteem. Emotional Education:   The focus of this group is to discuss what feelings/emotions are, and how they are experienced. Personal Choices and Values:   The focus of this group is to help patients assess and explore the importance of values in their lives, how their values affect their decisions, how they express their values and what opposes their expression.  Participation Level:  Minimal  Participation Quality:  Sharing and Anxious  Affect:  Anxious, Flat and Tearful  Cognitive:  Lacking  Insight: Lacking  Engagement in Group:  Limited  Modes of Intervention:  Activity, Clarification, Discussion, Education and Problem-solving  Additional Comments:   Patients were encouraged to demonstrate awareness by processing experiences that have strengthened self-awareness, personal values, and appreciation of life. Patients were provided with mindfulness strategies from [employer]. Therapist taught strategies to help decrease avoidance, disconnect thoughts from actions, acceptance of negative experiences rather than change or control symptoms and behave in accordance with broader life values. Patients were taught about clarifying goals and values and committed to behaving accordingly. Therapist taught alternative focal point techniques as a way to supplement relaxation skills for the management of the acute episodes of pain.  Suicidal/Homicidal: Negativewithout intent/plan  Plan: (1) [facility] (2) Pt will discuss and plan for a family therapy  session (3) Pt will meet with Dr.Cabos for follow-up (4) Pt will continue to take all medications as prescribed (5) Pt will complete all assignments before returning to PHP   Diagnosis: No Primary Diagnosis found   No diagnosis found.    Larayne Baxley, LCSW 08/21/2014

## 2014-08-21 NOTE — Addendum Note (Signed)
Addended by: Nehemiah MassedOBOS, FERNANDO A on: 08/21/2014 05:52 PM   Modules accepted: Orders, SmartSet
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
## 2014-08-21 NOTE — Psych (Signed)
[facility] Behavioral Health Partial Program Assessment Note  Date: 08/20/14  Name: Savannah Patel MRN: [id number]  Chief Complaint: Severe anxiety  HPI: Patient is a 24 y.o. Caucasian female presents with severe anxiety and depression.  Patient was enrolled in partial psychiatric program on 08/20/14  Patient states that she recently started having severe anxiety, panic attacks, which were severe enough to be highly disruptive to her daily activities. She states she went to see a counselor, and came to hospital- ws prescribed Ativan PRNs for anxiety,  was referred to [facility] She states that she cannot identify any clear triggers or reasons for her increased anxiety/panic, which she states has been ongoing over the last 2-3 weeks. Patient presents with anxious affect, and also endorses symptoms of depression as below. Endorses- poor sleep, poor appetite, low energy level,weight loss of about 4 lbs, decreased sense of self esteem, some degree of anhedonia, and guilt about school performance . Denies psychotic symptoms. .    Past Psychiatric History:  Reports history of depression, which she states tends to get worse over winter months. She reports history of panic attacks, anxiety, but with very significant worsening of anxiety symptoms over the last two weeks or so.  Denies psychosis. History of self cutting, but states she stopped self injurious behaviors back in 10/15. Denies any history of mania, hypomania.  Of note, she denies agoraphobia, denies Social Phobia, and is not endorsing PTSD. She has been on Lexapro in the past but it caused side effects, insomnia. 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 Roselyn Bering.    Substance Abuse History: Denies drug abuse, states stopped drinking alcohol late last year, but denies any pattern of alcohol abuse or dependence .  Medical History (+) smoker- 1PPD- denies medical illnesses   No past surgical history on file.  Past Medical History  Diagnosis Date   . PCOS (polycystic ovarian syndrome) 2007  . Dysmenorrhea   . Herpes simplex infection of eyelid   . History of recurrent UTIs   . Depression   . Anxiety    Outpatient Encounter Prescriptions as of 08/20/2014  Medication Sig  . EPINEPHrine 0.3 mg/0.3 mL IJ SOAJ injection Inject 0.3 mg into the muscle as needed (for allergic reaction).   . fluconazole (DIFLUCAN) 150 MG tablet Take 1 tablet (150 mg total) by mouth once. Take one tablet.  Repeat in 48 hours if symptoms are not completely resolved. (Patient not taking: Reported on 07/18/2014)  . Levonorgestrel (SKYLA) 13.5 MG IUD by Intrauterine route as directed. Skyla IUD  . loratadine (CLARITIN) 10 MG tablet Take 10 mg by mouth daily.  Marland Kitchen LORazepam (ATIVAN) 1 MG tablet Take 1 tablet (1 mg total) by mouth every 8 (eight) hours as needed for anxiety.  . nitrofurantoin, macrocrystal-monohydrate, (MACROBID) 100 MG capsule 1 po after sex (Patient not taking: Reported on 07/18/2014)  . ondansetron (ZOFRAN) 4 MG tablet Take 1 tablet (4 mg total) by mouth every 6 (six) hours. (Patient not taking: Reported on 08/16/2014)   Allergies  Allergen Reactions  . Iodine     SOB  . Other Anaphylaxis  . Azithromycin Nausea And Vomiting and Other (See Comments)    Headaches  . Penicillins Rash and Other (See Comments)    Difficulty Breathing    History  Substance Use Topics  . Smoking status: Current Every Day Smoker -- 1.00 packs/day for 7 years    Types: Cigarettes  . Smokeless tobacco: Never Used     Comment: 1/2 pack  .  Alcohol Use: No   Social History: Single, no children, Archivistcollege student, lives alone, has Emergency planning/management officerpet cat, no legal issues, no SO at this time Family History  Problem Relation Age of Onset  . Colon cancer Mother   . Hypertension Mother   . Ovarian cysts Mother   . Osteoporosis Maternal Grandmother   . Brain cancer Paternal Grandfather     Family History- states her father is alcoholic. States parents often separate for periods of  months , but are currently back together. She has one sister who lives in [place], and who recently had a child. States mother and aunt have history of depression and anxiety. Review of Systems No headache, no ocular issues, no chest pain, or shortness of breath except during panic attacks, no nausea, no vomiting, no current dysuria, urgency, (+) long history of eczema.     Mental Status Exam: Appearance:  Well groomed and Casually dressed Psychomotor::  Slightly anxious, vaguely restless  Attention span and concentration: Normal Behavior: cooperative, adequate rapport can be established and slightly anxious  Speech:  normal pitch and normal volume Mood:  depressed and anxious Affect:  labile and congruent  Thought Process:  goal directed and within normal limits Thought Content:  not suicidal and not homicidal-no hallucinations, no delusions Orientation:  0x3  Cognition:  grossly intact Insight:  Fair Judgment:  present Estimate of Intelligence: Average Fund of knowledge: Aware of current events Memory: Recent and remote intact Abnormal movements: None Gait and station: Normal  Assessment: Patient is a 24 year old single female, college student, who has a history of depression and anxiety. Recent severe exacerbation of panic attacks , some of which have been severe. She cannot identify any particular triggers. Most significant recent event is her sister having a baby in [place], but patient does not see correlation between this positive, happy event and increased symptoms. She is describing depression, neuro-vegetative symptoms of depression, and panic attacks. No agoraphobia or Social Phobia.   Diagnosis: Primary Diagnosis: Panic disorder without agoraphobia [F41.0] 1. Panic disorder without agoraphobia   2. Major depressive disorder, recurrent, severe without psychotic features   3. PTSD (post-traumatic stress disorder)     Indications for admission: severity and acuity of  symptoms merits this level of care to minimize risk of self injurious behaviors or need for psychiatric admission  Plan: Admit to PHP. Agrees to start REMERON 7.5 mgrs QHS . - script given x 10 days . ( Prefers to start at this lower dose ) Side effects discussed . Ativan 0.5 mgrs BID and 1 mgrs QHS for Anxiety Will obtain TSH to rule out thyroid dysfunction.  ( paper scripts given)   Treatment options and alternatives reviewed with patient and patient understands      Nehemiah MassedOBOS, Roslynn Holte, MD

## 2014-08-22 ENCOUNTER — Other Ambulatory Visit (HOSPITAL_COMMUNITY): Payer: BLUE CROSS/BLUE SHIELD | Admitting: Licensed Clinical Social Worker

## 2014-08-22 DIAGNOSIS — F332 Major depressive disorder, recurrent severe without psychotic features: Secondary | ICD-10-CM

## 2014-08-22 DIAGNOSIS — F4310 Post-traumatic stress disorder, unspecified: Secondary | ICD-10-CM

## 2014-08-22 DIAGNOSIS — F410 Panic disorder [episodic paroxysmal anxiety] without agoraphobia: Secondary | ICD-10-CM

## 2014-08-22 NOTE — Psych (Signed)
[facility] BH PHP THERAPIST PROGRESS NOTE  Alayla Dethlefs [id number]   Date:  08/22/2014 Time:  11:00 AM -12:30 PM  Group Topic/Focus:  Wellness Toolbox:   The focus of this group is to discuss various aspects of wellness, balancing those aspects and exploring ways to increase the ability to experience wellness. Patients discussed ways to incorporate activities in the day that will help increase over all well-being.    Participation Level:  Active  Participation Quality:  Appropriate and Attentive  Affect:  Appropriate  Cognitive:  Alert and Appropriate  Insight: Improving  Engagement in Group:  Engaged  Modes of Intervention:  Activity and Discussion  Additional Comments: Group topic for today involved incorporating activities in the day that will support patients in overall well being.The group started with a check in. Patient is looking into resources at school that will help her because of school anxiety. She reports that she went back to her place and she had an  increase in anxiety because she got her panic attacks when she was alone at her place. She reports that the "sped up" feeling is gone though and can go back to reading and is calmer. Therapist introduced a reading from "The Book of Awakening" and discussed how doing a daily meditation can be helpful in starting your day and helpful in putting you in the right mindset for your day. It sets up for positive experiences during the day. Therapist read a daily medication titled "Birds and Ornithologists".  Patients discussed the message of the reading. Caelie discussed how meditation has helped with her anxiety and talked about a phone app called "Stop Breath Think" has helped her with guided meditation and strategies to help with her anxiety. Kyliee shows good motivation and participation in group.      Suicidal/Homicidal: No  Plan: 1.Lizza will incorporate activities in her day to help with increasing the experience of wellness  and over all well-being.2.Therapist will continue to teach Rozena coping strategies to manage her anxiety.   [facility] BH PHP THERAPIST PROGRESS NOTE  Date:  08/22/2014 Time:  1:00 PM-2:00 PM  Group Topic/Focus:  Rediscovering Joy:   The focus of this group the use of insights through poetry to develop cognitive strategies for effective coping including having patients reconnect and affirm positive experiences in their life.   Participation Level:  Active  Participation Quality:  Appropriate and Attentive  Affect:  Appropriate  Cognitive:  Alert and Appropriate  Insight: Improving  Engagement in Group:  Engaged  Modes of Intervention:  Activity, Discussion and Exploration  Additional Comments: Group topic was healthy attitudes that will assist patients on their road to recovery. The discussion was introduced through the spoken word poetry of Zane Herald. Patients discussed how life can knock you down and her poetry affirms this quality of life that is universal. It also can be a place of wonder. The universality of the the struggles helps patients in realizing it is a part of life for everyone and that everyone gets knocked down and they are not alone. There is also the recognition that there are a lot of positive experiences and aspects to life as well that make life worthwhile. Soila described being very moved by the poetry and how it reaffirmed in beautiful way both our struggles and the value and beauty in life. She had euthymic mood and good participation and motivation. I think that cognitive strategies that help her see that she is not alone, and the positive experiences in  life including are insights that allow for positive coping.   Suicidal/Homicidal: No  Plan:1.Patient cognitive insights as healthy coping strategies.2.Therapist work with patient on building insights to help with effective coping strategies.   Diagnosis: Primary Diagnosis: Panic disorder without agoraphobia  [F41.0]    1. Panic disorder without agoraphobia   2. Major depressive disorder, recurrent, severe without psychotic features   3. PTSD (post-traumatic stress disorder)       Bowman,Mary A 08/22/2014

## 2014-08-23 ENCOUNTER — Other Ambulatory Visit (HOSPITAL_COMMUNITY): Payer: Self-pay | Admitting: Psychiatry

## 2014-08-24 ENCOUNTER — Other Ambulatory Visit (HOSPITAL_COMMUNITY): Payer: BLUE CROSS/BLUE SHIELD | Admitting: Licensed Clinical Social Worker

## 2014-08-24 VITALS — BP 108/70 | HR 67 | Ht 65.5 in | Wt 108.8 lb

## 2014-08-24 DIAGNOSIS — F332 Major depressive disorder, recurrent severe without psychotic features: Secondary | ICD-10-CM

## 2014-08-24 DIAGNOSIS — F410 Panic disorder [episodic paroxysmal anxiety] without agoraphobia: Secondary | ICD-10-CM

## 2014-08-24 NOTE — Psych (Signed)
   [facility]  Delice BisonJessica Brickle [id number]  Session Time: 1300-1400  Participation Level: Active  Behavioral Response: CasualAlertAnxious  Type of Therapy: Psycho-education Group  Treatment Goals addressed: Anxiety and Coping  Interventions: CBT, DBT, Strength-based, Supportive and Reframing  Summary: Delice BisonJessica Berres is a 24 y.o. female who presents with depressive and anxiety symptoms.  States she has been in the partial program for one week and states it is going well.  "I am learning a lot of skills, but nothing will help my anxiety."  According to pt, she has tried yoga, meditation and other interventions; but nothing is helping.   Suicidal/Homicidal: Negativewithout intent/plan  Therapist Response: Discharge Planning:  Focused on setting treatment goals, assessing and exploring current/new resources, encouraged pt to make a plan for keeping herself safe while in the program and after discharge and discussed aftercare plans.    Plan: Patient will continue the partial program for it's entirety.  Diagnosis:    Jeri Modenaita Ki Luckman, M.Ed

## 2014-08-24 NOTE — Psych (Signed)
D. Patient presented with appropriate affect, pleasant mood and denied any suicidal or homicidal ideations, no auditory or visual hallucinations and only complaint of still difficulty with anxiety at times, particularly during this time period as she is preparing for final exams at [employer] and is majoring in Lobbyistcomputer science and math.  Patient reported trying Lexapro several weeks back which made her have strange and paranoid thoughts, states stopped the Lexapro but then became very anxious and ended up being evaluated again for possible need of inpatient services.  Patient reported she did not want to go inpatient at that time and agreed to try Partial Hospitalization Program as admitted the need for increased services due to her increased symptoms of anxiety and some depression.  Patient reported liking PHP and was using some of the breathing techniques she had learned to help with periods of increased anxiety.   Patient reported a history of self mutilation, cutting herself in the thigh area by history with last episode 03/2014.  Patient reported she had gotten better about not harming herself as has learned physical activities to replace the release she was getting with self harm.  States likes doing roller derby or grueling runs when gets this urge now and no plans or thoughts to harm self recently or presently.  Patient reported a 0 on depression, a 0 on hopelessness and a 3 on anxiety currently on a scale of 0-10 with 0 the worst she has experienced and 10 the most.  Patient scored a 13 on the PHQ9 depression scale but admitted most symptoms had improved more in the last week.  A. Patient reports positive outcomes already with attending PHP.  Patient admits she is timid with taking medications due to her poor experience with Lexapro and only wanted to continue with Ativan, a 1/2 of a 1/2 of 1mg  tablet 1-2 times per day, at least through her upcoming exam period over the next 2 weeks.  Patient agreed to seek out  emergency services [facility] if any changes in her thoughts over the coming weeks, if began to have thoughts of wanting to harm self or others or problems managing expected increased anxiety with final exams.  R. Discussed typical natural experience with managing episodes of increased stressors and discussed patient's acknowledged outlets to handle increased stressors.  Patient to continue with PHP currently and to continue to discuss possible medication options with Dr. Jama Flavorsobos in the coming weeks.  Patient with a positive outlook in preparation for exams and determined to remain in school to complete studies.  Patient stable at this time with not thoughts of wanting to harm self or others.  Patient to inform MD if continues to have problems with sleep as admits this has decreased some but reports getting enough.  [facility] to call as needed and [facility]

## 2014-08-24 NOTE — Psych (Signed)
   [facility]  Delice BisonJessica Jenne [id number]   Date:  08/24/2014 Time:  4:57 PM  Group Topic/Focus: Early Warning Signs:   The focus of this group is to help patients identify signs or symptoms they exhibit before slipping into an unhealthy state or crisis. Emotional Education:   The focus of this group is to discuss what feelings/emotions are, and how they are experienced. Relapse Prevention Planning:   The focus of this group is to define relapse and discuss the need for planning to combat relapse.  Participation Level:  Active  Participation Quality:  Attentive  Affect:  Anxious  Cognitive:  Alert  Insight: Improving  Engagement in Group:  Engaged  Modes of Intervention:  Clarification, Discussion and Education  Additional Comments:  Patients were asked to discuss past traumatic experiences that have become triggers for anxiety. They were assisted in developing insight into how past traumatic experiences have led to anxiety in present unrelated circumstances. Many of the patients were able to offer valuable insight regarding their own past traumatic experiences and have demonstrated a reduction in the experience of their personal anxiety producing incident. Patients were asked to discuss important past and present life conflicts that also contribute to feelings of worry. These lists were processed within the group.  Suicidal/Homicidal: Negativewithout intent/plan  Plan:  (1) [facility] (2) Pt will discuss and plan for a family therapy session (3) Pt will meet with Dr.Cabos for follow-up (4) Pt will continue to take all medications as prescribed (5) Pt will complete all assignments before returning to PHP   Diagnosis: Primary Diagnosis: Panic disorder without agoraphobia [F41.0]    1. Panic disorder without agoraphobia   2. Major depressive disorder, recurrent, severe without psychotic features       [facility]  [facility] 08/24/2014

## 2014-08-27 ENCOUNTER — Other Ambulatory Visit (HOSPITAL_COMMUNITY): Payer: Self-pay

## 2014-08-28 ENCOUNTER — Other Ambulatory Visit (HOSPITAL_COMMUNITY): Payer: BLUE CROSS/BLUE SHIELD | Admitting: Psychiatry

## 2014-08-28 ENCOUNTER — Ambulatory Visit (HOSPITAL_COMMUNITY): Payer: Self-pay | Admitting: Licensed Clinical Social Worker

## 2014-08-28 DIAGNOSIS — F332 Major depressive disorder, recurrent severe without psychotic features: Secondary | ICD-10-CM

## 2014-08-28 DIAGNOSIS — F410 Panic disorder [episodic paroxysmal anxiety] without agoraphobia: Secondary | ICD-10-CM

## 2014-08-28 DIAGNOSIS — F4310 Post-traumatic stress disorder, unspecified: Secondary | ICD-10-CM

## 2014-08-28 NOTE — Psych (Signed)
   [facility]  Savannah BisonJessica Patel [id number]  Session Time: 1.40pm-2.25pm  Participation Level: Active  Behavioral Response: Well GroomedAlert, Intiallly anxious  Type of Therapy: Activity Group  Treatment Goals addressed: Coping  Interventions: Other: Yoga and Mindfulness  Summary: Savannah BisonJessica Patel is a 24 y.o. female who presents with slightly anxious affect. Pt reported some experience with yoga.  Pt fully participated in activity. Pt reflected that she felt less anxious at the end of the practice.  Suicidal/Homicidal: Nowithout intent/plan  Therapist Response: Introduced yoga and provided psycho education as to benefits and assisting w/ building coping skills for training relaxation response.  Led group through Orthoptistgrounding chair class.  Focused on intention of noticing grounding and supported by earth and focusing on either movement, sensation or breath.  Processed effects for each member.  Plan: continue w/ PHP  Diagnosis: Primary Diagnosis: Panic disorder without agoraphobia [F41.0]    1. Panic disorder without agoraphobia   2. Major depressive disorder, recurrent, severe without psychotic features   3. PTSD (post-traumatic stress disorder)       Forde RadonLeanne Yates, [facility] 08/28/14

## 2014-08-28 NOTE — Psych (Signed)
   [facility]  Delice BisonJessica Dobratz [id number]     Date:  08/28/2014 Time:  2:30 PM -4:00 PM  Group Topic/Focus:  Mental Health Diagnosis Education  Participation Level:  Active  Participation Quality:  Appropriate, Attentive and Sharing  Affect:  Appropriate  Cognitive:  Alert, Appropriate and Oriented  Insight: Appropriate and Improving  Engagement in Group:  Engaged  Modes of Intervention:  Discussion, Education and Exploration  Additional Comments: Group topic was on panic attacks. The group read a handout about how panic attacks happen, how they develop and how they are maintained. Therapist focused on how a person's experience of stress can set the stage for a panic attack and led a discussion for patient's to connect this insight to their lives. [name] is able to see the connection of negative life events recently and increase of panic attacks. She shows good use of therapy techniques as she relates that she has been working on not focusing on bodily symptoms and not feeling into catastrophic thoughts when she starts to have the symptoms. She had good engagement in the group. She reports that she has not had a panic attack since before she started the group and had an incident of feeling hyper and speeded up last Thursday but it did not go into a full panic attack. She took her medications and it helped.   Suicidal/Homicidal: No  Plan:1.Patient continue to utilize coping strategies to help manage panic and anxiety.2.Therapist continue to educate client on strategies to manage her mental health diagnosis.    Diagnosis: Primary Diagnosis: Panic disorder without agoraphobia [F41.0]    1. Panic disorder without agoraphobia   2. Major depressive disorder, recurrent, severe without psychotic features   3. PTSD (post-traumatic stress disorder)       Bowman,Mary A 08/28/2014

## 2014-08-28 NOTE — Psych (Signed)
[facility] BH PHP THERAPIST PROGRESS NOTE  Nicola Heinemann [id number]    Date:  08/28/2014 Time:  2:25 PM  Group Topic/Focus: Crisis Planning:   The purpose of this group is to help patients create a crisis plan for use upon discharge or in the future, as needed. Overcoming Stress:   The focus of this group is to define stress and help patients assess their triggers. Rediscovering Joy:   The focus of this group is to explore various ways to relieve stress in a positive manner.  Participation Level:  Active  Participation Quality:  Appropriate  Affect:  Appropriate  Cognitive:  Alert  Insight: Good  Engagement in Group:  Engaged  Modes of Intervention:  Activity, Discussion, Education and Problem-solving  Additional Comments:  Patients were assisted in developing better coping skills and building confidence through their ability to manage stress related incidents. Cognitive Behavioral Therapy techniques were used to help patients identify, explore and develop a plan to change distorted and/or negative behaviors. Therapist encouraged patients to identify cognitive activities to build into their day. Patients were provided with examples of cognitive challenges to build into the day: including reading, playing instruments, writing poetry/books/songs, learning lyrics to a new song, puzzles, games, and keeping up with sports or other interests.  Suicidal/Homicidal: Negativewithout intent/plan  Plan:  (1) Pt will return to [facility] on 08/29/14 (2) Pt will discuss and plan for a family therapy session (3) Pt will meet with Dr.Cabos for follow-up (4) Pt will continue to take all medications as prescribed (5) Pt will complete all assignments before returning to PHP  Diagnosis: Primary Diagnosis: Panic disorder without agoraphobia [F41.0]    1. Panic disorder without agoraphobia   2. Major depressive disorder, recurrent, severe without psychotic features   3. PTSD (post-traumatic stress disorder)        [place] Nabor, LCSW 08/28/2014         Adult Psychoeducational Group Note  Date:  08/28/2014 Time:  2:28 PM  Group Topic/Focus: Building Self Esteem:   The Focus of this group is helping patients become aware of the effects of self-esteem on their lives, the things they and others do that enhance or undermine their self-esteem, seeing the relationship between their level of self-esteem and the choices they make and learning ways to enhance self-esteem. Developing a Wellness Toolbox:   The focus of this group is to help patients develop a "wellness toolbox" with skills and strategies to promote recovery upon discharge. Recovery Goals:   The focus of this group is to identify appropriate goals for recovery and establish a plan to achieve them.  Participation Level:  Active  Participation Quality:  Appropriate  Affect:  Appropriate  Cognitive:  Alert  Insight: Good  Engagement in Group:  Engaged  Modes of Intervention:  Education, Exploration, Orientation and Support  Additional Comments:  Therapist discussed the positive impact of incorporating a healthy lifestyle to support their wellness goals. Patents were encouraged to engage in aerobic exercise, maintain a healthy diet, and to try to get adequate sleep. Patients were taught how to monitor sleep and to set a schedule to prepare for bedtime. Patients were educated in understanding how a healthy lifestyle can improve cognition. Therapist facilitated coordination with a yoga instructor to demonstrate implementation of healthy lifestyle behaviors.  Suicidal/Homicidal: Negativewithout intent/plan  Plan:  (1) Pt will return to [facility] on 08/29/14 (2) Pt will discuss and plan for a family therapy session (3) Pt will meet with Dr.Cabos for follow-up (4) Pt will  continue to take all medications as prescribed (5) Pt will complete all assignments before returning to PHP  Diagnosis: Primary Diagnosis: Panic disorder without  agoraphobia [F41.0]    1. Panic disorder without agoraphobia   2. Major depressive disorder, recurrent, severe without psychotic features   3. PTSD (post-traumatic stress disorder)       Ladan Vanderzanden 08/28/2014, 2:28 PM

## 2014-08-29 ENCOUNTER — Other Ambulatory Visit (HOSPITAL_COMMUNITY): Payer: BLUE CROSS/BLUE SHIELD | Admitting: Licensed Clinical Social Worker

## 2014-08-29 DIAGNOSIS — F332 Major depressive disorder, recurrent severe without psychotic features: Secondary | ICD-10-CM | POA: Diagnosis not present

## 2014-08-29 DIAGNOSIS — F4310 Post-traumatic stress disorder, unspecified: Secondary | ICD-10-CM

## 2014-08-29 DIAGNOSIS — F410 Panic disorder [episodic paroxysmal anxiety] without agoraphobia: Secondary | ICD-10-CM

## 2014-08-29 NOTE — Progress Notes (Signed)
08/28/14  PHP Medication Management/Psychiatric Follow Up Note  Duration - 25 minutes   DX Panic Disorder Depression NOS, consider Seasonal Affective Disorder   Subjective - patient reports she is feeling better and states she has not had any recent severe panic attacks. In general she has felt calmer, less agitated. She does state that she has had some significant anticipation anxiety, particularly as she had not been able to establish any specific triggers for her increased anxiety symptoms, so that it is harder for her to actively avoid these. Of note, she states she has decided not to take any psychiatric medications and is currently not taking prescribed Remeron or Ativan. States she decided she did not need these because she has been feeling better and anxiety symptoms have partially subsided. She states she is very sensitive to medication side effects.  Objective : I have reviewed case with therapists. As above, patient is currently reporting significant improvement compared to her pre-admission status. Her anxiety has subsided and does not describe any ongoing or recent severe or crippling panic attack. She states her mood is also better. She denies severe depressive symptoms or neuro-vegetative symptoms at this time. She states she has had some academic stressors, particularly regarding examinations/ tests. She states " these may be keeping me more anxious than I would be otherwise". She is not taking medications- we have reviewed the likelihood that medication, if effective, would decrease possible recurrences/frequency of panic, and also help address mood and free floating anxiety symptoms. She is not interested at this time. She does state she [facility]. Currently she feels more optimistic and is currently future oriented, looking forward to going to live with her sister in [place] in a few weeks. Responds well to support,  encouragement.  Current Medications- NONE  As noted, patient states she has decided not to take psychiatric medications at this time ( Remeron)   ROS- does not endorse chest pain, SOB, coughing, abdominal pain or distress, no rash, appetite improving .  MSE- at present she is alert, attentive, well related, calmer than she had presented initially, but still somewhat anxious. Mood is described as improved and at this time does not endorse depression or significant sadness. No thought disorder, no SI, no HI, no psychotic symptoms, and future oriented. As noted , planning to go to [place] in a few weeks .  Assessment- improving. No ongoing severe panic attacks. Mood seems better, affect brighter. She does continue to have some anticipation anxiety and some free floating anxiety, but states much more manageable than before. Has opted not to take any prescribed psychiatric medications at present, but is invested in psychotherapy and [facility]  Plan- Continue PHP. D/C Remeron ( as noted, patient did not actually take it ). No medications prescribed by me at this time.  Of note, had requested TSH, currently result pending.  Nehemiah MassedFernando Juanya Villavicencio, MD

## 2014-08-29 NOTE — Psych (Signed)
   [facility]  Savannah BisonJessica Patel [id number]    Date: 08/29/14  Time: 1:00 PM-2:00 PM  Group Topic/Focus:  CBT-Effective Strategies for Management of Emotions-CBT strategies were applied to panic attacks  Participation Level:  Active  Participation Quality:  Appropriate, Attentive and Sharing  Affect:  Appropriate  Cognitive:  Alert and Appropriate  Insight: Improving  Engagement in Group:  Engaged  Modes of Intervention:  Activity, Discussion, Education and Exploration  Additional Comments: The group topic was managing emotions and in particular panic. Patient reviewed and discussed a handout "The Thinking-Feeling Connection". Therapist explained that thinking responses and the types of thoughts we have play a large role in panic disorder. While we assume that situations "make us feel" a particular emotion, there is actually step in between. Our thoughts influence our feelings. The same situation can be perceived in different ways, leading to different emotional experiences. Therapist pointed out particular thoughts that can lead to panic. Patients learned that types of thoughts also can play a role in other mental health symptoms such as depression. Patients were encouraged to pause and recognize automatic thoughts that are negative are usually unhelpful thoughts. My view is that this exercise will be helpful for patient in managing her anxiety. She also reported that she finds she is hesitant to try new activities where she feels her anxiety will come back. She is trying hot yoga class that is supposed to help with her anxiety but it also will be a way for her to try a new activity where she is not avoiding situations that make her anxious. Patient shows willingness to try strategies that she is learning in treatment that are positive signs of good investment with treatment.   Suicidal/Homicidal: No  Plan: 1.Patient will utilize suggestions from this exercise of  identifying unhelpful thoughts that will help in management of her emotions.2.Patient will implement CBT strategies in life situations to help with anxiety and in general her emotions.3.Patient will continue to apply helpful strategies she is learning in treatment to life situations to help manage panic and anxiety.    Diagnosis: Primary Diagnosis: Panic disorder without agoraphobia [F41.0]    1. Panic disorder without agoraphobia   2. Major depressive disorder, recurrent, severe without psychotic features   3. PTSD (post-traumatic stress disorder)       Savannah Patel A 08/29/2014

## 2014-08-30 ENCOUNTER — Ambulatory Visit (HOSPITAL_COMMUNITY): Payer: Self-pay | Admitting: Licensed Clinical Social Worker

## 2014-08-30 ENCOUNTER — Other Ambulatory Visit (HOSPITAL_COMMUNITY): Payer: Self-pay

## 2014-08-30 DIAGNOSIS — F410 Panic disorder [episodic paroxysmal anxiety] without agoraphobia: Secondary | ICD-10-CM

## 2014-08-30 DIAGNOSIS — F332 Major depressive disorder, recurrent severe without psychotic features: Secondary | ICD-10-CM

## 2014-08-30 NOTE — Psych (Signed)
   [facility]  Delice BisonJessica Eplin [id number]    Date:  08/29/14 Time:  4:00pm  Group Topic/Focus: Recovery Goals:   The focus of this group is to identify appropriate goals for recovery and establish a plan to achieve them. Relapse Prevention Planning:   The focus of this group is to define relapse and discuss the need for planning to combat relapse.  Participation Level:  Active  Participation Quality:  Appropriate  Affect:  Anxious and Flat  Cognitive:  Alert and Confused  Insight: Limited  Engagement in Group:  Engaged and Improving  Modes of Intervention:  Activity, Confrontation, Discussion and Education  Additional Comments:  Group members were taught and asked to discuss their problem-solving skills related to stress. Patients were asked to define a source of on-going stress clearly, brainstorm multiple solutions, listing the pros and cons of each solution, seeking input from others, selecting and implementing a plan of action, and evaluating and readjusting the outcome. Patients were aided in developing a clear understanding of the use of the problem-solving skills. These new skills were modeled and demonstrated this through activities.  Suicidal/Homicidal: Negativewithout intent/plan  Plan:  (1) [facility] (2) Pt will discuss and plan for a family therapy session (3) Pt will meet with Dr.Cabos for follow-up (4) Pt will continue to take all medications as prescribed (5) Pt will complete all assignments before returning to PHP   Diagnosis: Primary Diagnosis: Panic disorder without agoraphobia [F41.0]    1. Panic disorder without agoraphobia   2. Major depressive disorder, recurrent, severe without psychotic features       Andoni Busch, LCSW 08/29/14

## 2014-08-31 ENCOUNTER — Telehealth (HOSPITAL_COMMUNITY): Payer: Self-pay | Admitting: Licensed Clinical Social Worker

## 2014-08-31 ENCOUNTER — Other Ambulatory Visit (HOSPITAL_COMMUNITY): Payer: Self-pay

## 2014-09-03 ENCOUNTER — Telehealth (HOSPITAL_COMMUNITY): Payer: Self-pay | Admitting: Licensed Clinical Social Worker

## 2014-09-03 ENCOUNTER — Other Ambulatory Visit (HOSPITAL_COMMUNITY): Payer: Self-pay | Admitting: Licensed Clinical Social Worker

## 2014-09-04 ENCOUNTER — Other Ambulatory Visit (HOSPITAL_COMMUNITY): Payer: BLUE CROSS/BLUE SHIELD | Admitting: Licensed Clinical Social Worker

## 2014-09-04 ENCOUNTER — Ambulatory Visit (HOSPITAL_COMMUNITY): Payer: Self-pay | Admitting: Licensed Clinical Social Worker

## 2014-09-04 DIAGNOSIS — F410 Panic disorder [episodic paroxysmal anxiety] without agoraphobia: Secondary | ICD-10-CM | POA: Diagnosis not present

## 2014-09-04 DIAGNOSIS — F332 Major depressive disorder, recurrent severe without psychotic features: Secondary | ICD-10-CM

## 2014-09-04 DIAGNOSIS — F4310 Post-traumatic stress disorder, unspecified: Secondary | ICD-10-CM | POA: Insufficient documentation

## 2014-09-04 NOTE — Psych (Signed)
   [facility]  Delice BisonJessica Hennessee [id number]   Date: 09/04/14 Time: 11:00 AM -12:00 PM  Group Topic/Focus:  Mental Health Diagnosis and Progress in Symptom Management  Participation Level:  Active  Participation Quality:  Appropriate and Attentive  Affect:  Appropriate  Cognitive:  Alert and Appropriate  Insight: Improving  Engagement in Group:  Engaged  Modes of Intervention:  Discussion, Exploration and Problem-solving  Additional Comments: Group topic mental health diagnosis and progress in symptom management. Group began with a check in. She related that she had a couple of episodes of panic over the weekend. She is better at being able to identifying the cause but guarded at opening up too much in group about personal things. In a general way she said that the frequency of panic is new and can connect to stressors, disappointments and stressors being overwhelming. She also said that it doesn't help to focus on her panic as that can lead to escalation in panic symptoms. Therapist pointed out that finding the cause is helpful because she can start to work on strategies to help and she agreed. She said she is trying hard not to avoid her panic and work through it even though it can lead to distressing symptoms. Therapist pointed out that this is part of mastery and facing obstacles is part of the process of mastery. [name] shows active application of principles she is learning from group.   Suicidal/Homicidal: No  Plan:1.[name] continue to address sources of panic in constructive ways and to find ways to improve coping strategies by addressing anxiety and not avoiding it.2.Therapist continue to teach [place] coping strategies to manage anxiety and panic.   [facility] BH PHP THERAPIST GROUP NOTE  Date: 09/04/14  Time: 12:30 PM-2:00 PM   Group Topic/Focus:  Group topic is Distress Tolerance and Recognizing Unhelpful Escape Methods Used to Manage Distressing  Emotions  Participation Level:  Active  Participation Quality:  Appropriate and Attentive  Affect:  Appropriate  Cognitive:  Alert and Appropriate  Insight: Improving  Engagement in Group:  Engaged  Modes of Intervention:  Activity, Discussion and Education  Additional Comments: The group topic was on distress tolerance. The group read a handout on "Understanding Distress Intolerance". The group discussed what distress tolerance means and how the more we fear, struggle with and try to avoid any form of distress generally the worse that distress gets. They discussed how distress tolerance develops, and distress tolerance escape methods. Patients identified their own distress intolerance beliefs and identified for each of the escape methods a healthy way to deal with stress and uncomfortable emotions. Barnhard recognized avoidance of anxiety as one of her unhelpful escape methods. Therapist pointed out that identifying negative behaviors is a positive step as that is how you change it and develop better coping strategies. Dendinger participated actively and in this group by sharing appropriately and listening respectfully when others shared.  Suicidal/Homicidal: No  Plan:1.[place] work on changing unhelpful coping strategies to manage distress and implement healthier strategies 2.Therapist continue to educate patient on distress tolerance module.   Diagnosis: Primary Diagnosis: Panic disorder without agoraphobia [F41.0]    1. Panic disorder without agoraphobia   2. Major depressive disorder, recurrent, severe without psychotic features   3. PTSD (post-traumatic stress disorder)       Tine Mabee A 09/04/2014

## 2014-09-04 NOTE — Psych (Signed)
   [facility]  Delice BisonJessica Tripp [id number]   Date:  09/04/2014 Time:  5:28 PM  Group Topic/Focus: Dimensions of Wellness:   The focus of this group is to introduce the topic of wellness and discuss the role each dimension of wellness plays in total health. Emotional Education:   The focus of this group is to discuss what feelings/emotions are, and how they are experienced. Identifying Needs:   The focus of this group is to help patients identify their personal needs that have been historically problematic and identify healthy behaviors to address their needs. Managing Feelings:   The focus of this group is to identify what feelings patients have difficulty handling and develop a plan to handle them in a healthier way upon discharge.  Participation Level:  Active  Participation Quality:  Attentive and Sharing  Affect:  Appropriate  Cognitive:  Alert  Insight: Improving  Engagement in Group:  Developing/Improving  Modes of Intervention:  Clarification, Discussion, Education and Problem-solving  Additional Comments:  Therapist noted that during discussions many of the patients made decisions based on lack of confidence, low self-esteem, reality misperceptions and low energy. Therapist assessed for signs of social withdrawal among patient comments, affect, and group interactions. Therapist encouraged patients to identify and verbalize positive affirmation statements about themselves. Therapist assisted patients and solicited input from other group members when a member appeared reluctant, despondent or incapable of verbalizing positive statements about themselves. Therapist taught patients coping skills to help increase verbalization of positive self-statements and facilitated role-play and practice among group members.  Therapist focused session on exploring, processing, identifying and reframing self-defeating interpersonal, social and environmental patterns and beliefs.    Suicidal/Homicidal: Negativewithout intent/plan  Therapist Response: Therapist noted that [name] is improving in her understanding of the way her negative behaviors have affected both her actions and emotions.   Plan:  (1) [facility] (2) Pt will discuss and plan for a family therapy session (3) Pt will meet with Dr.Cabos for follow-up (4) Pt will continue to take all medications as prescribed (5) Pt will complete all assignments before returning to PHP   Diagnosis: Primary Diagnosis: Panic disorder without agoraphobia [F41.0]    1. Panic disorder without agoraphobia   2. Major depressive disorder, recurrent, severe without psychotic features   3. PTSD (post-traumatic stress disorder)       Margie Urbanowicz, LCSW 09/04/2014

## 2014-09-05 ENCOUNTER — Other Ambulatory Visit (HOSPITAL_COMMUNITY): Payer: Self-pay

## 2014-09-06 ENCOUNTER — Other Ambulatory Visit (HOSPITAL_COMMUNITY): Payer: BLUE CROSS/BLUE SHIELD | Admitting: Licensed Clinical Social Worker

## 2014-09-06 DIAGNOSIS — F410 Panic disorder [episodic paroxysmal anxiety] without agoraphobia: Secondary | ICD-10-CM

## 2014-09-06 DIAGNOSIS — F332 Major depressive disorder, recurrent severe without psychotic features: Secondary | ICD-10-CM | POA: Diagnosis not present

## 2014-09-06 DIAGNOSIS — F4310 Post-traumatic stress disorder, unspecified: Secondary | ICD-10-CM

## 2014-09-06 NOTE — Psych (Signed)
   [facility]  Delice BisonJessica Kostelecky [id number]  [facility] PHP THERAPIST GROUP NOTE  Date: 09/06/14  Time: 1:00 PM-2:00 PM    Group Topic/Focus:  How family dynamics impact symptoms  Participation Level:  Active  Participation Quality:  Appropriate, Attentive and Sharing  Affect:  Euthymic  Cognitive:  Alert and Appropriate  Insight: Improving  Engagement in Group:  Engaged  Modes of Intervention:  Discussion, Exploration and Support  Additional Comments: Group topic today was on how the family dynamic impacts patient's symptoms. Patient talked about dynamics in family that she feels could be a source of her anxiety. She discussed her concern about her sister making bad decisions and how this how this has impacted the family.  Therapist worked on helping her understand that she has to recognize she does not have the ability to change other people.  She needs to think about how she wants to approach her sister that will offer support and guide her to make a better decision and not try to take away her autonomy.  [place] using treatment actively to work on issues which is a positive sign for growth and progress.   Suicidal/Homicidal: no  Plan:1.[place] work in group to gain insight to family patterns to help with more effective coping of these dynamics as well as to help with management of anxiety.2.Therapist continue to utilize various interventions such as family therapy, CBT and DBT to help her manage her anxiety.   Diagnosis: Primary Diagnosis: Panic disorder without agoraphobia [F41.0]    1. Panic disorder without agoraphobia   2. Major depressive disorder, recurrent, severe without psychotic features   3. PTSD (post-traumatic stress disorder)       Sole Lengacher A 09/06/2014

## 2014-09-07 ENCOUNTER — Encounter (HOSPITAL_COMMUNITY): Payer: Self-pay

## 2014-09-07 ENCOUNTER — Other Ambulatory Visit (HOSPITAL_COMMUNITY): Payer: BLUE CROSS/BLUE SHIELD | Admitting: Licensed Clinical Social Worker

## 2014-09-07 ENCOUNTER — Ambulatory Visit (HOSPITAL_COMMUNITY): Payer: Self-pay | Admitting: Licensed Clinical Social Worker

## 2014-09-07 VITALS — BP 113/73 | HR 77 | Ht 66.0 in | Wt 110.2 lb

## 2014-09-07 DIAGNOSIS — F4310 Post-traumatic stress disorder, unspecified: Secondary | ICD-10-CM

## 2014-09-07 DIAGNOSIS — F410 Panic disorder [episodic paroxysmal anxiety] without agoraphobia: Secondary | ICD-10-CM

## 2014-09-07 DIAGNOSIS — F332 Major depressive disorder, recurrent severe without psychotic features: Secondary | ICD-10-CM | POA: Diagnosis not present

## 2014-09-07 NOTE — Psych (Signed)
[facility]  Savannah BisonJessica Patel [id number]  Date:  09/07/14 Time:  4:46pm  Group Topic/Focus: Conflict Resolution:   The focus of this group is to discuss the conflict resolution process and how it may be used upon discharge. Emotional Education:   The focus of this group is to discuss what feelings/emotions are, and how they are experienced. Healthy Communication:   The focus of this group is to discuss communication, barriers to communication, as well as healthy ways to communicate with others.Managing Feelings:   The focus of this group is to identify what feelings patients have difficulty handling and develop a plan to handle them in a healthier way upon discharge. Personal Choices and Values:   The focus of this group is to help patients assess and explore the importance of values in their lives, how their values affect their decisions, how they express their values and what opposes their expression.  Participation Level:  Active  Participation Quality:  Appropriate, Attentive and Sharing  Affect:  Appropriate  Cognitive:  Alert  Insight: Good  Engagement in Group:  Developing/Improving and Engaged  Modes of Intervention:  Discussion, Education, Exploration, Problem-solving and Support  Additional Comments: Patient was able to gain insight into her own relationship with her mother. She recognized that her mother is a strong person but feels that she "over moms her" and "overwhelms me with her love". She was also able to recognize that much of her stress and anxiety symptoms are attributed to a lack of organizations. Therapist was able to connect comments about military life and discipline since both the patients parents are prior military, to develop a system of planning and organization in order to address and complete necessary tasks.    Suicidal/Homicidal: Negativewithout intent/plan  Therapist Response: Therapist processed feelings of anxiety related to her  sister's decision to leave her three month old daughter to go out on a submarine for three months at a time.  Patient talked about dynamics in family and related them as her most recent cause of anxiety. Therapist actively listened to her insight about her sister's decision. Therapist acknowledged the challenges her sister's decision would make on her family. Therapist aided her in understanding that she cannot influence her sister's decision by forcing her to accept her own as absolute fact. Therapist modeled and provided her with strategies in which to assist family members with offering her sister support, suggestions and options but ultimately leading the patient to accept that she cannot change another's person's decision by force of opinion. Therapist connected comments to demonstrate to the patient that the family's insistence on asserting themselves into her sister's life will only lead to her shutting them out and pushing back to assert her independence. Therapist reframed questions and modeled support techniques in order to demonstrate how to offer support to her sister and to demonstrate guidance and a respect for her autonomy at the same time. Patient affirmed that she was unaware of how she and her family were behaving and stated that she intends to speak with both her mother and father to develop a strategy as to how to best support her sister.    Plan:  (1[facility] (2) Pt will discuss and plan for a family therapy session (3) Pt will meet with Dr.Cabos for follow-up (4) Pt will continue to take all medications as prescribed (5) Pt will complete all assignments before returning to PHP   Diagnosis: Primary Diagnosis: Panic  disorder without agoraphobia [F41.0]    1. Panic disorder without agoraphobia   2. Major depressive disorder, recurrent, severe without psychotic features   3. PTSD (post-traumatic stress disorder)       Trenese Haft, LCSW 09/06/14

## 2014-09-07 NOTE — Psych (Signed)
   [facility]  Savannah Patel [id number]   Date:  09/07/2014 Time:  3:47 PM  Group Topic/Focus: Overcoming Stress:   The focus of this group is to define stress and help patients assess their triggers. Recovery Goals:   The focus of this group is to identify appropriate goals for recovery and establish a plan to achieve them.  Participation Level:  Active  Participation Quality:  Appropriate  Affect:  Anxious  Cognitive:  Alert  Insight: Improving  Engagement in Group:  Developing/Improving and Engaged  Modes of Intervention:  Discussion, Problem-solving and Support  Additional Comments:  [name] stated that she had been experiencing some feelings of anxiety surrounding the results of her most recent exam. She reported feeling so stressed that "If I didn't pass this class, I just going to go home, take an Ativan and lie down."   Suicidal/Homicidal: Negativewithout intent/plan  Therapist Response: Group members were taught and asked to discuss their problem-solving skills related to stress. Patients were asked to define a source of on-going stress clearly, brainstorm multiple solutions, listing the pros and cons of each solution, seeking input from others, selecting and implementing a plan of action, and evaluating and readjusting the outcome. Patients were aided in developing a clear understanding of the use of the problem-solving skills. These new skills were modeled and demonstrated this through activities.  Plan:  (1) [facility] (2) Pt will discuss and plan for a family therapy session (3) Pt will meet with Dr.Cabos for follow-up (4) Pt will continue to take all medications as prescribed (5) Pt will complete all assignments before returning to PHP   Diagnosis: Primary Diagnosis: Panic disorder without agoraphobia [F41.0]    1. Panic disorder without agoraphobia   2. Major depressive disorder, recurrent, severe without psychotic  features   3. PTSD (post-traumatic stress disorder)       [name], LCSW 09/07/2014

## 2014-09-07 NOTE — Psych (Signed)
D. Patient presented with appropriate affect, pleasant mood and stated her anxiety had decreased dramatically since now being finished with final exams with school.  Patient denied any suicidal or homicidal ideations and stated she was only now taking medication for anxiety on a limited basis and as needed.  A. Patient scored herself a 1 on the PHQ9 depression scale today as admits she is much improved.  Patient is planning to take the summer off and is gong to be visiting her sister and new 493 month old niece in [place] for the summer, leaving on 09/19/14.  Patient stated no depression currently, no hopelessness and anxiety minimal and only periodic.  R. Patient stable at this time with noted increased relaxed posture,  slower rate of speech and reports increased sleep and appetite.  No concerns noted or reported by patient at this time.  Patient plans to continue with PHP until prepares to leave for summer trip to [place].  Encouraged patient to have her sister help her look into a possible behavioral health provider there in case she needs any services over the summer and patient agreed with plan.

## 2014-09-07 NOTE — Psych (Signed)
[facility]  Delice BisonJessica Strauser [id number]    Date: 09/07/14   Time: 11:00 AM -12:30 PM    Group Topic/Focus:  Resilience  Participation Level:  Active  Participation Quality:  Appropriate, Attentive and Sharing  Affect:  Appropriate  Cognitive:  Alert and Appropriate  Insight: Appropriate and Improving  Engagement in Group:  Engaged  Modes of Intervention:  Discussion, Education and Exploration  Additional Comments: Group topic for today was discussion on resilience. Group began with a check in. Begeman related that her anxiety is improving. She realizes that it is a physical reaction and therapist pointed out that it appears to be a physical reaction to stress. She is exploring triggers and finding ways to address them. She did get full blown anxiety last night with her aunt but it went away. She is looking to undercover triggers so she can address and better manage them. Therapist agreed with her that emotions can be contagious and she feels that sensing her aunt's emotions made her anxious. She doesn't do a physical scan of symptoms any longer but still has a mental scan that feeds into anxiety. In other words she stresses when she starts to experience anxiety that feeds into her anxiety. Her main stressor is school so she is developing study strategies to help manage. We discussed cognitive strategies such as reframing to help lessen anxiety and take off the pressure. She is going to get a roommate to help with financial stressors, she is going to get away from stressors on a vacation. She pointed out that it builds new neural passageways and therapist agreed that it helps with a new perspective in coming back to one's stressors. She also is going to be friendlier.   In group activity members learned about elements of resilience including traits of hardiness which means is described as a commitment to finding a meaningful purpose in life, or a belief that one can  influence one's surroundings and the outcomes of events, and the belief that one can learn from positive and negative experiences. Self-enhancement includes the development of overly positive biases in favor of the self. Repressive coping involves avoidance of unpleasant thoughts, emotions, and memories. Positive emotion involves the use of responses such as laughter, when faced with an adverse event. Patient participated actively and appropriately in group activity.   [facility] BH PHP THERAPIST GROUP NOTE  Date: 09/07/14 Time: 1:00 PM-2:00 PM    Group Topic/Focus:  Worksheet on "Cue Controlled Relaxation" and "Let the Feelings Flow:   Participation Level:  Active  Participation Quality:  Appropriate and Sharing  Affect:  Appropriate  Cognitive:  Alert and Appropriate  Insight: Improving  Engagement in Group:  Engaged  Modes of Intervention:  Activity, Discussion, Education and Exploration  Additional Comments: Group topic for today involved worksheet on "Cue Controlled Relaxation" and "Let the Feelings Flow". Therapist explained that cue controlled relaxation is helpful because it is a technique that you can practice during the day, regardless of where you are. You can use it to relax yourself when you are the most tense. Therapist pointed out to patient that this can be helpful for her in situations where she is anxious. In let the feelings flow worksheet therapist explained the steps of letting go of a feeling include recognize the feeling, express the feeling, clarify the feeling, explain the feeling and accept the feeling. When you can identify and explain the feeling's source, you can begin to respond with new behavior. Patient  agreed that processing feelings by sharing in group was helpful. Therapist explained the concept of catharsis and that one feels better in releasing feelings. Patient was appropriate and active in participation through sharing.   Suicidal/Homicidal:  no  Plan:1.Patient utilize group to process feelings and learn effective strategies to manage anxiety.2.Therapist educate patient on effective coping strategies to manage anxiety.    Diagnosis: Primary Diagnosis: Panic disorder without agoraphobia [F41.0]    1. Panic disorder without agoraphobia   2. Major depressive disorder, recurrent, severe without psychotic features   3. PTSD (post-traumatic stress disorder)       Eleanna Theilen A 09/07/2014

## 2014-09-10 ENCOUNTER — Other Ambulatory Visit (HOSPITAL_COMMUNITY): Payer: Self-pay | Admitting: Licensed Clinical Social Worker

## 2014-09-11 ENCOUNTER — Other Ambulatory Visit (HOSPITAL_COMMUNITY): Payer: Self-pay

## 2014-09-12 ENCOUNTER — Ambulatory Visit (HOSPITAL_COMMUNITY): Payer: Self-pay | Admitting: Licensed Clinical Social Worker

## 2014-09-12 ENCOUNTER — Ambulatory Visit (HOSPITAL_COMMUNITY): Payer: Self-pay | Admitting: Psychiatry

## 2014-09-12 DIAGNOSIS — F332 Major depressive disorder, recurrent severe without psychotic features: Secondary | ICD-10-CM

## 2014-09-12 DIAGNOSIS — F410 Panic disorder [episodic paroxysmal anxiety] without agoraphobia: Secondary | ICD-10-CM

## 2014-09-12 DIAGNOSIS — F4310 Post-traumatic stress disorder, unspecified: Secondary | ICD-10-CM

## 2014-09-12 NOTE — Psych (Signed)
   [facility]  Delice BisonJessica Palo [id number]    Date:  09/12/2014 Time:  4:27 PM  Group Topic/Focus: Coping With Mental Health Crisis:   The purpose of this group is to help patients identify strategies for coping with mental health crisis.  Group discusses possible causes of crisis and ways to manage them effectively. Overcoming Stress:   The focus of this group is to define stress and help patients assess their triggers. Rediscovering Joy:   The focus of this group is to explore various ways to relieve stress in a positive manner.  Self Care:   The focus of this group is to help patients understand the importance of self-care in order to improve or restore emotional, physical, spiritual, interpersonal, and financial health.  Participation Level:  Active  Participation Quality:  Appropriate  Affect:  Appropriate  Cognitive:  Appropriate  Insight: Appropriate  Engagement in Group:  Engaged  Modes of Intervention:  Activity, Discussion, Education, Exploration, Problem-solving and Support  Additional Comments:  Patients were taught about defining the problem clearly, brainstorming multiple solutions, listing the pros and cons of each solution, seeking input from others, selecting and implementing a plan of action, and evaluating and readjusting the outcome. Patients displayed a clear understanding of the use of the problem-solving skills, and were asked to provide examples. Patients were asked about how they are implementing calming and grounding techniques in their daily life and when these techniques are used. Patients were asked to discuss a time or times when excessive worry was beyond the scope of rationality and they felt unable to control it. Patients were assisted in exploring worries concerning issues related to family, personal safety, health, and employment, among other things. The client was taught how to recognize, stop, and postpone worry until the agreed upon  worry time. Patients were given an activity and were shown how to respond to issues by utilizing techniques such as: thought stopping, relaxation, and redirection of attention. Each patient was provided with feedback on how they were planning to use the techniques in their daily lives.  Suicidal/Homicidal: Negativewithout intent/plan  Plan:  (1) [facility] (2) Pt will discuss and plan for a family therapy session (3) Pt will continue to take all medications as prescribed (4) Pt will complete all assignments before returning to PHP   Diagnosis: Primary Diagnosis: Panic disorder without agoraphobia [F41.0]    1. Panic disorder without agoraphobia   2. Major depressive disorder, recurrent, severe without psychotic features   3. PTSD (post-traumatic stress disorder)       Tianne Plott, LCSW 09/12/2014

## 2014-09-12 NOTE — Psych (Signed)
   [facility]  Delice BisonJessica Montanari [id number]   [facility]  Date: 09/12/14 Time: 1:00 PM-2:00 PM    Group Topic/Focus:  Stress Management  Participation Level:  Active  Participation Quality:  Appropriate and Attentive  Affect:  Appropriate  Cognitive:  Alert and Appropriate  Insight: Improving  Engagement in Group:  Engaged  Modes of Intervention:  Discussion, Exploration, Problem-solving and Coping  Additional Comments: The main focus of today's group was stress management. Group started with a check in. The group discussed where their stress comes from including the unknown, self-defeating thoughts, and predicting the worst case scenario. The group including Keeny could identify specific stressors such as school. The group identified coping strategies to manage stress including exposure, creating a check list and that daily maintenance strategies that help maintain one's well-being that are helpful so one is more able to manage stress when it occurs. We discussed how doing something creative is a good outlet for releasing energy and described it as like releasing an Teaching laboratory technicianelectric charge. [name] has worked at identifying what has led to her severe panic and she thinks that one of the main causes is school. Therapist pointed out that in managing her anxiety she has to both know the cause and also utilize strategies to address the underlying causes. Ocon agrees and knows she will have to apply strategies in studying to help reduce anxiety. [name] has used group productively to work on strategies to apply to specific mental health symptoms that led to admission.   Suicidal/Homicidal: no  Plan:1.Loya apply specific stress reduction strategies to help with anxiety.2.Therapist reinforce effective coping and continue to educate Suchan on strategies to help with anxiety and panic.   Diagnosis: Primary Diagnosis: Panic disorder without agoraphobia  [F41.0]    1. Panic disorder without agoraphobia   2. Major depressive disorder, recurrent, severe without psychotic features   3. PTSD (post-traumatic stress disorder)       Bowman,Mary A 09/12/2014

## 2014-09-13 ENCOUNTER — Ambulatory Visit (HOSPITAL_COMMUNITY): Payer: BLUE CROSS/BLUE SHIELD | Admitting: Licensed Clinical Social Worker

## 2014-09-13 DIAGNOSIS — F332 Major depressive disorder, recurrent severe without psychotic features: Secondary | ICD-10-CM | POA: Diagnosis not present
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
## 2014-09-13 NOTE — Progress Notes (Signed)
09/12/14  PHP Medication Management/Psychiatric Follow Up Note  Duration - 25 minutes   DX Panic Disorder Depression NOS, consider Seasonal Affective Disorder   Subjective -  She reports she is doing well. She states she is feeling " much better" overall and has not had any recent significant panic attacks, which as noted had been quite severe prior to [facility]. She states that she realizes a lot of her anxiety/panic had to do with her academic load and concerns about grades . Due to grades, she was facing academic probation and loss of her financial aid, and because of this had been very anxious . She states that fortunately she has done better than she had expected academically and her semester grades are good enough to avoid probation or to jeopardize her financial aid. Since finding this out, states " it's like a huge weight has been lifted off me ". At this time she is more future oriented and is looking forward to going to [place] next week to spend Summer with her sister .   Objective : I have reviewed case with therapists. As noted, patient has been doing well , and her overall mood is reported as much improved . She has also not had any recent panic attacks. At this time minimizes any neuro-vegetative symptoms, denies anhedonia, states sleep, appetite, energy level all improved. She has regained " a few pounds maybe " ( had lost weight in the context of increased psychiatric symptoms) . As noted in prior notes , she has opted not to take psychiatric medications and is not on any at this time. Today we focused on disposition /discharge plans, as she approaches discharge from the program. Psychotherapy recommended, and Staff looking into referral options in [place], as she is going to be spending the next few months there. After her return, she plans to establish therapy through college . Has not had TSH done .   Current Medications- NONE  As noted, patient states she has decided  not to take psychiatric medications at this time ( Remeron)   ROS- does not endorse headache, visual difficulties,  Denies  chest pain or  SOB, no  Coughing, no  abdominal pain or distress, no vomiting ,  no rash, appetite improving .  MSE- she presents  alert, attentive, well related, calm, no psychomotor agitation or restlessness . Speech normal, good eye contact, well groomed ,  Mood is euthymic and denies any depression. Affect is full in range , bright and not anxious at this time.No thought disorder, no SI, no HI, no psychotic symptoms, and reports feeling optimistic. As noted, currently focused on going to [place] next week.   Assessment-  Currently much improved, with euthymic mood and full range of affect, no ongoing panic or severe anxiety.  States panic and anxiety most likely related to academic pressures, now resolved as above . Not on any psychiatric medications at her request. Currently essentially asymptomatic , and focused on her vacation to [place].  Plan-  As discussed with therapists and patient, [facility]. Currently not on any psychiatric medications. Follow up/referrals being worked on by team.   Nehemiah MassedFernando Avaley Coop, MD

## 2014-09-14 ENCOUNTER — Ambulatory Visit (HOSPITAL_COMMUNITY): Payer: Self-pay | Admitting: Licensed Clinical Social Worker

## 2014-09-14 ENCOUNTER — Other Ambulatory Visit (HOSPITAL_COMMUNITY): Payer: BLUE CROSS/BLUE SHIELD | Attending: Psychiatry | Admitting: Licensed Clinical Social Worker

## 2014-09-14 DIAGNOSIS — F4310 Post-traumatic stress disorder, unspecified: Secondary | ICD-10-CM

## 2014-09-14 DIAGNOSIS — F332 Major depressive disorder, recurrent severe without psychotic features: Secondary | ICD-10-CM

## 2014-09-14 DIAGNOSIS — F410 Panic disorder [episodic paroxysmal anxiety] without agoraphobia: Secondary | ICD-10-CM

## 2014-09-14 NOTE — Psych (Signed)
   [facility]ER[facility]  Delice BisonJessica Allebach [id number]   [facility]  Date:09/14/14  Time: 1:00 PM-2:00 PM  Group Topic/Focus:  Topic of Group was Denial as a Defense Mechanism that was Introduced through a comedic video  Participation Level:  Active  Participation Quality:  Appropriate and Attentive  Affect:  Appropriate  Cognitive:  Alert  Insight: Improving  Engagement in Group:  Engaged and Supportive  Modes of Intervention:  Activity, Discussion, Education, Exploration and Problem-solving  Additional Comments: A comedic short video was shown that helped the group to discuss the topic of denial. Therapist explained that it is a defense that protects ourselves from uncomfortable feelings but that it keeps us from looking at ourselves and our behaviors truthfully. It maintains behaviors that are self-defeating but that you may not be ready to change. Savannah Patel was able to see how it played a part in her anxiety and panic attacks. She described it as a fire all around her and she kept saying that everything was all right. Therapist pointed out the benefits of continued treatment to help her connect anxiety to stressors and also to identify denial that may stand in the way of her addressing the stressors in healthy ways. Savannah Patel has researched a therapist to see when she comes back. Therapist gave her positive feedback about her investment and a positive sign of continued progress.   Suicidal/Homicidal: no  Plan: 1.Langseth utilize healthy coping strategies to manage stress, anxiety and depression.2.Mathieu continue with follow up treatment.    Diagnosis: Primary Diagnosis: Panic disorder without agoraphobia [F41.0]    1. Panic disorder without agoraphobia   2. Major depressive disorder, recurrent, severe without psychotic features   3. PTSD (post-traumatic stress disorder)       Bowman,Mary A 09/14/2014

## 2014-09-14 NOTE — Psych (Signed)
   [facility]  Savannah BisonJessica Patel [id number]  Date:  09/14/2014 Time:  5:01 PM  Group Topic/Focus: Conflict Resolution:   The focus of this group is to discuss the conflict resolution process and how it may be used upon discharge. Early Warning Signs:   The focus of this group is to help patients identify signs or symptoms they exhibit before slipping into an unhealthy state or crisis. Healthy Communication:   The focus of this group is to discuss communication, barriers to communication, as well as healthy ways to communicate with others. Managing Feelings:   The focus of this group is to identify what feelings patients have difficulty handling and develop a plan to handle them in a healthier way upon discharge. Primary and Secondary Emotions:   The focus of this group is to discuss the difference between primary and secondary emotions. Wrap-Up Group:   The focus of this group is to help patients review their daily goal of treatment and discuss progress on daily workbooks.  Participation Level:  Active  Participation Quality:  Attentive  Affect:  Appropriate  Cognitive:  Appropriate  Insight: Good  Engagement in Group:  Engaged  Modes of Intervention:  Activity, Confrontation, Discussion, Education, Exploration, Limit-setting, Problem-solving and Role-play  Additional Comments:  Patients were asked to list any and all relationships that have been lost due to a pattern of antisocial behavior. A discussion was initiated to address patient's current past significant relationships, including themes related to grief, interpersonal role disputes, role transitions, and skill deficits. Patients were supported as they shared stories and concerns related to both past and present interpersonal relationships. As negative relationship patterns were reviewed, the client was confronted with their responsibility for the actions that resulted in the broken relationships. Patients were  asked to identify what behavior of their own led to the break-up of the relationship. Group members provided each other with support as they openly described past issues in which they have experienced a sense of loss and pain. Group members were firmly and consistently confronted with the reality of owning behavior that caused pain to others and resulted in their breaking off the relationship. Patients were asked to identify personal insensitivity to the needs and feelings of others. Role-reversal techniques were used to attempt to get them in touch with the pain caused in others due to lack of respect, disloyalty, vengeance, jealousy, aggression, or dishonesty.   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 Panic disorder without agoraphobia   2. Major depressive disorder, recurrent, severe without psychotic features   3. PTSD (post-traumatic stress disorder)       Savannah Halley, LCSW 09/14/2014

## 2014-09-17 ENCOUNTER — Other Ambulatory Visit (HOSPITAL_COMMUNITY): Payer: Self-pay

## 2014-09-18 ENCOUNTER — Other Ambulatory Visit (HOSPITAL_COMMUNITY): Payer: Self-pay

## 2014-09-19 ENCOUNTER — Other Ambulatory Visit (HOSPITAL_COMMUNITY): Payer: Self-pay

## 2014-09-20 ENCOUNTER — Other Ambulatory Visit (HOSPITAL_COMMUNITY): Payer: Self-pay

## 2014-09-21 ENCOUNTER — Other Ambulatory Visit (HOSPITAL_COMMUNITY): Payer: Self-pay

## 2014-09-25 ENCOUNTER — Encounter (HOSPITAL_COMMUNITY): Payer: Self-pay | Admitting: Licensed Clinical Social Worker

## 2014-11-13 ENCOUNTER — Telehealth (HOSPITAL_COMMUNITY): Payer: Self-pay | Admitting: Licensed Clinical Social Worker

## 2014-11-23 ENCOUNTER — Encounter (HOSPITAL_COMMUNITY): Payer: Self-pay | Admitting: Licensed Clinical Social Worker

## 2014-12-10 ENCOUNTER — Telehealth: Payer: Self-pay | Admitting: Nurse Practitioner

## 2014-12-10 DIAGNOSIS — Z30432 Encounter for removal of intrauterine contraceptive device: Secondary | ICD-10-CM

## 2014-12-10 NOTE — Telephone Encounter (Signed)
Spoke with patient. Patient states that she would like to have her IUD removed at this time. States she has been having "hormonal issues." Requesting removal appointment this week. Appointment scheduled for IUD removal tomorrow 12/11/2014 at 1pm with Dr.Jertson. Patient is agreeable to date and time. Order placed for precert.  Cc: Braxton Feathers  Routing to provider for final review. Patient agreeable to disposition. Will close encounter.   Patient aware provider will review message and nurse will return call if any additional advice or change of disposition.

## 2014-12-10 NOTE — Telephone Encounter (Signed)
Patient would like to have her iud removed. °

## 2014-12-11 ENCOUNTER — Ambulatory Visit (INDEPENDENT_AMBULATORY_CARE_PROVIDER_SITE_OTHER): Payer: BLUE CROSS/BLUE SHIELD | Admitting: Obstetrics and Gynecology

## 2014-12-11 ENCOUNTER — Encounter: Payer: Self-pay | Admitting: Obstetrics and Gynecology

## 2014-12-11 VITALS — BP 108/68 | HR 80 | Resp 14 | Wt 109.0 lb

## 2014-12-11 DIAGNOSIS — Z3009 Encounter for other general counseling and advice on contraception: Secondary | ICD-10-CM | POA: Diagnosis not present

## 2014-12-11 DIAGNOSIS — N943 Premenstrual tension syndrome: Secondary | ICD-10-CM | POA: Diagnosis not present
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
## 2014-12-14 NOTE — Progress Notes (Signed)
No encounter

## 2014-12-19 ENCOUNTER — Telehealth (HOSPITAL_COMMUNITY): Payer: Self-pay | Admitting: Licensed Clinical Social Worker

## 2015-01-09 NOTE — Telephone Encounter (Signed)
htdhez

## 2015-08-27 ENCOUNTER — Telehealth: Payer: Self-pay | Admitting: *Deleted

## 2015-08-27 NOTE — Telephone Encounter (Signed)
08 Pap recall due 02/2015 due to LGSIL on pap 2015  Past History:   01/31/14 Pap, LGSIL 10/27/12 Pap, Negative (first pap)  Pt has not scheduled annual exam.  Please call pt to schedule.  Thank you.

## 2015-08-27 NOTE — Telephone Encounter (Signed)
Spoke with patient- she is scheduled 09-18-15 @ 2:30pm -eh

## 2015-08-27 NOTE — Telephone Encounter (Signed)
LMTC in regards to setting up AEX /PAP -eh

## 2015-08-27 NOTE — Telephone Encounter (Signed)
Pt has scheduled annual exam.  Recall extended until 09/2015.  Routing to provider for final review.  Closing encounter.

## 2015-09-18 ENCOUNTER — Encounter: Payer: Self-pay | Admitting: Obstetrics and Gynecology

## 2015-09-18 ENCOUNTER — Ambulatory Visit (INDEPENDENT_AMBULATORY_CARE_PROVIDER_SITE_OTHER): Payer: BLUE CROSS/BLUE SHIELD | Admitting: Obstetrics and Gynecology

## 2015-09-18 VITALS — BP 102/62 | HR 92 | Resp 15 | Ht 66.0 in | Wt 106.0 lb

## 2015-09-18 DIAGNOSIS — R599 Enlarged lymph nodes, unspecified: Secondary | ICD-10-CM

## 2015-09-18 DIAGNOSIS — Z113 Encounter for screening for infections with a predominantly sexual mode of transmission: Secondary | ICD-10-CM | POA: Diagnosis not present

## 2015-09-18 DIAGNOSIS — R636 Underweight: Secondary | ICD-10-CM

## 2015-09-18 DIAGNOSIS — R87619 Unspecified abnormal cytological findings in specimens from cervix uteri: Secondary | ICD-10-CM | POA: Insufficient documentation

## 2015-09-18 DIAGNOSIS — R591 Generalized enlarged lymph nodes: Secondary | ICD-10-CM | POA: Diagnosis not present

## 2015-09-18 DIAGNOSIS — N915 Oligomenorrhea, unspecified: Secondary | ICD-10-CM | POA: Diagnosis not present

## 2015-09-18 DIAGNOSIS — Z0000 Encounter for general adult medical examination without abnormal findings: Secondary | ICD-10-CM

## 2015-09-18 DIAGNOSIS — Z124 Encounter for screening for malignant neoplasm of cervix: Secondary | ICD-10-CM

## 2015-09-18 DIAGNOSIS — R87612 Low grade squamous intraepithelial lesion on cytologic smear of cervix (LGSIL): Secondary | ICD-10-CM

## 2015-09-18 DIAGNOSIS — Z01419 Encounter for gynecological examination (general) (routine) without abnormal findings: Secondary | ICD-10-CM

## 2015-09-18 DIAGNOSIS — R634 Abnormal weight loss: Secondary | ICD-10-CM | POA: Diagnosis not present

## 2015-09-18 LAB — CBC WITH DIFFERENTIAL/PLATELET
Basophils Absolute: 0 cells/uL (ref 0–200)
Basophils Relative: 0 %
EOS ABS: 864 {cells}/uL — AB (ref 15–500)
Eosinophils Relative: 9 %
HCT: 39.8 % (ref 35.0–45.0)
Hemoglobin: 13.2 g/dL (ref 11.7–15.5)
LYMPHS PCT: 18 %
Lymphs Abs: 1728 cells/uL (ref 850–3900)
MCH: 30.2 pg (ref 27.0–33.0)
MCHC: 33.2 g/dL (ref 32.0–36.0)
MCV: 91.1 fL (ref 80.0–100.0)
MONOS PCT: 12 %
MPV: 10.6 fL (ref 7.5–12.5)
Monocytes Absolute: 1152 cells/uL — ABNORMAL HIGH (ref 200–950)
NEUTROS ABS: 5856 {cells}/uL (ref 1500–7800)
NEUTROS PCT: 61 %
Platelets: 270 10*3/uL (ref 140–400)
RBC: 4.37 MIL/uL (ref 3.80–5.10)
RDW: 12.6 % (ref 11.0–15.0)
WBC: 9.6 10*3/uL (ref 3.8–10.8)

## 2015-09-18 LAB — COMPREHENSIVE METABOLIC PANEL
ALT: 11 U/L (ref 6–29)
AST: 13 U/L (ref 10–30)
Albumin: 4.2 g/dL (ref 3.6–5.1)
Alkaline Phosphatase: 49 U/L (ref 33–115)
BILIRUBIN TOTAL: 0.3 mg/dL (ref 0.2–1.2)
BUN: 11 mg/dL (ref 7–25)
CHLORIDE: 105 mmol/L (ref 98–110)
CO2: 26 mmol/L (ref 20–31)
Calcium: 9 mg/dL (ref 8.6–10.2)
Creat: 0.81 mg/dL (ref 0.50–1.10)
GLUCOSE: 79 mg/dL (ref 65–99)
POTASSIUM: 4.1 mmol/L (ref 3.5–5.3)
Sodium: 139 mmol/L (ref 135–146)
TOTAL PROTEIN: 6.9 g/dL (ref 6.1–8.1)

## 2015-09-18 LAB — TSH: TSH: 3.69 mIU/L

## 2015-09-18 MED ORDER — MEDROXYPROGESTERONE ACETATE 5 MG PO TABS: ORAL_TABLET | ORAL | Status: DC

## 2015-09-18 NOTE — Patient Instructions (Signed)
EXERCISE AND DIET:  We recommended that you start or continue a regular exercise program for good health. Regular exercise means any activity that makes your heart beat faster and makes you sweat.  We recommend exercising at least 30 minutes per day at least 3 days a week, preferably 4 or 5.  We also recommend a diet low in fat and sugar.  Inactivity, poor dietary choices and obesity can cause diabetes, heart attack, stroke, and kidney damage, among others.    ALCOHOL AND SMOKING:  Women should limit their alcohol intake to no more than 7 drinks/beers/glasses of wine (combined, not each!) per week. Moderation of alcohol intake to this level decreases your risk of breast cancer and liver damage. And of course, no recreational drugs are part of a healthy lifestyle.  And absolutely no smoking or even second hand smoke. Most people know smoking can cause heart and lung diseases, but did you know it also contributes to weakening of your bones? Aging of your skin?  Yellowing of your teeth and nails?  CALCIUM AND VITAMIN D:  Adequate intake of calcium and Vitamin D are recommended.  The recommendations for exact amounts of these supplements seem to change often, but generally speaking 600 mg of calcium (either carbonate or citrate) and 800 units of Vitamin D per day seems prudent. Certain women may benefit from higher intake of Vitamin D.  If you are among these women, your doctor will have told you during your visit.    PAP SMEARS:  Pap smears, to check for cervical cancer or precancers,  have traditionally been done yearly, although recent scientific advances have shown that most women can have pap smears less often.  However, every woman still should have a physical exam from her gynecologist every year. It will include a breast check, inspection of the vulva and vagina to check for abnormal growths or skin changes, a visual exam of the cervix, and then an exam to evaluate the size and shape of the uterus and  ovaries.  And after 26 years of age, a rectal exam is indicated to check for rectal cancers. We will also provide age appropriate advice regarding health maintenance, like when you should have certain vaccines, screening for sexually transmitted diseases, bone density testing, colonoscopy, mammograms, etc.      

## 2015-09-18 NOTE — Progress Notes (Signed)
Patient ID: Savannah Patel, female   DOB: April 24, 1991, 25 y.o.   MRN: [id number] 25 y.o. G0P0 SingleCaucasianF here for annual exam.  She hasn't done well with the skyla IUD or OCP's in the past, mood changes with both. Her mood has improved off of contraception. Anxiety resolved about a month after the sklya IUD was removed.  Period Duration (Days): 3-10 days  Period Pattern: (!) Irregular Menstrual Flow: Moderate, Light Menstrual Control: Tampon, Thin pad Dysmenorrhea: (!) Moderate Dysmenorrhea Symptoms: Cramping, Nausea  She typically doesn't have cramps, last cycle her cramps were intense, had emesis. The pain lasted 6 hours. Menses q 2-3 months (better than the 1-2 x a year that she had in the past). She has been seen by endocrinology in the past, diagnosed with PCOS. She does have facial hair that she gets rid of and a small amount on her lower abdomen. No change.  Sexually active, off and on. He may have other partners. No dyspareunia.  The patient reports a 10 lb weight loss recently, under stress, not eating as well. Not trying to loose weight   Patient's last menstrual period was 08/16/2015.          Sexually active: Yes.    The current method of family planning is condoms every time .    Exercising: Yes.    cardio Smoker:  former  Health Maintenance: Pap:  01-31-14 LSIL may include HPV or mild dysplasia (only abnormal) History of abnormal Pap:  yes TDaP:  unsure Gardasil: completed all 3    reports that she quit smoking about a year ago. Her smoking use included Cigarettes. She has a 7 pack-year smoking history. She has never used smokeless tobacco. 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   Past Medical History  Diagnosis Date  . PCOS (polycystic ovarian syndrome) 2007  . Dysmenorrhea   . Herpes simplex infection of eyelid   . History of  recurrent UTIs   . Depression   . Anxiety   . Abnormal Pap smear of cervix     9/15 LSIL    History reviewed. No pertinent past surgical history.  Current Outpatient Prescriptions  Medication Sig Dispense Refill  . clobetasol ointment (TEMOVATE) 0.05 %     . EPINEPHrine 0.3 mg/0.3 mL IJ SOAJ injection Inject 0.3 mg into the muscle as needed (for allergic reaction).     Marland Kitchen. loratadine (CLARITIN) 10 MG tablet Take 10 mg by mouth daily.    . [DISCONTINUED] escitalopram (LEXAPRO) 10 MG tablet Take 10 mg by mouth daily.     No current facility-administered medications for this visit.    Family History  Problem Relation Age of Onset  . Colon cancer Mother   . Hypertension Mother   . Ovarian cysts Mother   . Osteoporosis Maternal Grandmother   . Brain cancer Paternal Grandfather     Review of Systems  Constitutional: Negative.   HENT: Positive for congestion, postnasal drip and rhinorrhea.   Eyes: Negative.   Respiratory: Negative.   Cardiovascular: Negative.   Gastrointestinal: Negative.   Endocrine: Negative.   Genitourinary: Negative.   Musculoskeletal: Negative.   Skin: Negative.   Allergic/Immunologic: Negative.   Neurological: Negative.   Psychiatric/Behavioral: Negative.     Exam:   BP 102/62 mmHg  Pulse 92  Resp 15  Ht 5\' 6"  (1.676 m)  Wt 106  lb (48.081 kg)  BMI 17.12 kg/m2  LMP 08/16/2015  Weight change: @ Height:   Height:  (167.6 cm)  Ht Readings from Last 3 Encounters:  09/18/15  (1.676 m)  09/07/14  (1.676 m)  08/24/14 5' 5.5" (1.664 m)    General appearance: alert, cooperative and appears stated age Head: Normocephalic, without obvious abnormality, atraumatic Neck: no adenopathy, supple, symmetrical, trachea midline and thyroid normal to inspection and palpation Lungs: clear to auscultation bilaterally Breasts: normal appearance, no masses or tenderness Heart: regular rate and rhythm Abdomen: soft, non-tender; bowel  sounds normal; no masses,  no organomegaly Extremities: extremities normal, atraumatic, no cyanosis or edema Skin: Skin color, texture, turgor normal. No rashes or lesions Lymph nodes: Cervical, supraclavicular, and axillary nodes normal. Bilateral inguinal nodes were palpated, one node on the right side is 1.5 cm, all other nodes less than 1 cm. The node is smooth, mobile and not-tender Neurologic: Grossly normal   Pelvic: External genitalia:  no lesions              Urethra:  normal appearing urethra with no masses, tenderness or lesions              Bartholins and Skenes: normal                 Vagina: normal appearing vagina with normal color and discharge, no lesions              Cervix: no lesions and retroverted               Bimanual Exam:  Uterus:  normal size, contour, position, consistency, mobility, non-tender              Adnexa: no mass, fullness, tenderness               Rectovaginal: Confirms               Anus:  normal sphincter tone, no lesions  Chaperone was present for exam.  A:  Well Woman with normal exam  PCOS  Oligomenorrhea  Contraception  H/O LSIL  Weight loss, underweight. She feels it is secondary to stress  Slightly enlarged right inguinal lymph node  Screening for STD  P:   Pap with reflex hpv  STD testing  CBC, CMP, vit D, TSH  F/U in 1 month for evaluation of her enlarged lymph node  Discussed breast self exam  Discussed calcium and vit D intake  Continue condoms for contraception  Recommend she f/u with her primary for f/u if she has any further weight loss

## 2015-09-19 LAB — HEPATITIS C ANTIBODY: HCV Ab: NEGATIVE

## 2015-09-19 LAB — VITAMIN D 25 HYDROXY (VIT D DEFICIENCY, FRACTURES): VIT D 25 HYDROXY: 32 ng/mL (ref 30–100)

## 2015-09-19 LAB — STD PANEL
HEP B S AG: NEGATIVE
HIV 1&2 Ab, 4th Generation: NONREACTIVE

## 2015-09-20 LAB — IPS PAP TEST WITH REFLEX TO HPV

## 2015-09-20 LAB — IPS N GONORRHOEA AND CHLAMYDIA BY PCR

## 2015-10-17 ENCOUNTER — Telehealth: Payer: Self-pay | Admitting: Obstetrics and Gynecology

## 2015-10-17 NOTE — Telephone Encounter (Signed)
Please check if the patients lymph node has resolved.

## 2015-10-17 NOTE — Telephone Encounter (Signed)
Patient canceled her appointment 10/22/15 for f/u lymph node. Patient did not wish to reschedule.

## 2015-10-18 NOTE — Telephone Encounter (Signed)
Spoke with patient. Patient states that the lymph node has resolved. Denies any current concerns. She will return call to the office to reschedule this appointment if her symptoms return.  Routing to provider for final review. Patient agreeable to disposition. Will close encounter.

## 2015-10-22 ENCOUNTER — Ambulatory Visit: Payer: BLUE CROSS/BLUE SHIELD | Admitting: Obstetrics and Gynecology

## 2016-08-27 ENCOUNTER — Encounter: Payer: Self-pay | Admitting: Obstetrics and Gynecology

## 2016-08-27 ENCOUNTER — Ambulatory Visit: Payer: BLUE CROSS/BLUE SHIELD | Admitting: Obstetrics and Gynecology

## 2016-08-27 VITALS — BP 100/60 | HR 80 | Resp 16 | Wt 109.0 lb

## 2016-08-27 DIAGNOSIS — Z30012 Encounter for prescription of emergency contraception: Secondary | ICD-10-CM

## 2016-08-27 DIAGNOSIS — Z30014 Encounter for initial prescription of intrauterine contraceptive device: Secondary | ICD-10-CM | POA: Diagnosis not present

## 2016-08-27 DIAGNOSIS — Z113 Encounter for screening for infections with a predominantly sexual mode of transmission: Secondary | ICD-10-CM | POA: Diagnosis not present

## 2016-08-27 DIAGNOSIS — Z01812 Encounter for preprocedural laboratory examination: Secondary | ICD-10-CM | POA: Diagnosis not present

## 2016-08-27 HISTORY — PX: INTRAUTERINE DEVICE (IUD) INSERTION: SHX5877

## 2016-08-27 LAB — POCT URINE PREGNANCY: PREG TEST UR: NEGATIVE

## 2016-08-27 MED ORDER — ULIPRISTAL ACETATE 30 MG PO TABS: 30.0000 mg | ORAL_TABLET | Freq: Once | ORAL | Status: DC

## 2016-08-27 NOTE — Progress Notes (Signed)
GYNECOLOGY  VISIT   HPI: 26 y.o.   Single  Caucasian  female   G0P0 with Patient's last menstrual period was 08/21/2016.   here for birth control consult. The patient has had mood changes with the skyla IUD and OCP's in the past.  She has a h/o PCOS Cycles approximately q 6-8 weeks x 3-4 days. Saturating a regular tampon in 4-6 hours (never saturated). Cramps are currently mild for 1-2 days. Midol helps.  Sexually active, new partner in the last 2 months. Using condoms. A condom broke 3 days ago.  She is interested in emergency contraception.  Also interested in the paragard IUD  GYNECOLOGIC HISTORY: Patient's last menstrual period was 08/21/2016. Contraception:none  Menopausal hormone therapy: none         OB History    Gravida Para Term Preterm AB Living   0             SAB TAB Ectopic Multiple Live Births                     Patient Active Problem List   Diagnosis Date Noted  . Abnormal Pap smear of cervix   . Major depressive disorder, recurrent severe without psychotic features (HCC) 09/12/2014  . IUD (intrauterine device) in place 10/16/2013  . PCOS (polycystic ovarian syndrome)     Past Medical History:  Diagnosis Date  . Abnormal Pap smear of cervix    9/15 LSIL  . Anxiety   . Depression   . Dysmenorrhea   . Eczema   . Herpes simplex infection of eyelid   . History of recurrent UTIs   . PCOS (polycystic ovarian syndrome) 2007    No past surgical history on file.  Current Outpatient Prescriptions  Medication Sig Dispense Refill  . clobetasol ointment (TEMOVATE) 0.05 %     . EPINEPHrine 0.3 mg/0.3 mL IJ SOAJ injection Inject 0.3 mg into the muscle as needed (for allergic reaction).     Marland Kitchen loratadine (CLARITIN) 10 MG tablet Take 10 mg by mouth daily.     No current facility-administered medications for this visit.      ALLERGIES: Iodine; Other; Azithromycin; and Penicillins  Family History  Problem Relation Age of Onset  . Colon cancer Mother   .  Hypertension Mother   . Ovarian cysts Mother   . Osteoporosis Maternal Grandmother   . Brain cancer Paternal Grandfather     Social History   Social History  . Marital status: Single    Spouse name: N/A  . Number of children: N/A  . Years of education: N/A   Occupational History  . Not on file.   Social History Main Topics  . Smoking status: Former Smoker    Packs/day: 1.00    Years: 7.00    Types: Cigarettes    Quit date: 09/10/2014  . Smokeless tobacco: Never Used     Comment: 1/2 pack  . Alcohol use No  . Drug use: No  . Sexual activity: Yes    Partners: Male    Birth control/ protection: IUD     Comment: Skyla   Other Topics Concern  . Not on file   Social History Narrative  . No narrative on file    Review of Systems  Constitutional: Negative.   HENT: Negative.   Eyes: Negative.   Respiratory: Negative.   Cardiovascular: Negative.   Gastrointestinal: Negative.   Genitourinary: Negative.   Musculoskeletal: Negative.   Skin: Negative.   Neurological:  Negative.   Endo/Heme/Allergies: Negative.   Psychiatric/Behavioral: Negative.     PHYSICAL EXAMINATION:    BP 100/60 (BP Location: Right Arm, Patient Position: Sitting, Cuff Size: Normal)   Pulse 80   Resp 16   Wt 109 lb (49.4 kg)   LMP 08/21/2016   BMI 17.59 kg/m     General appearance: alert, cooperative and appears stated age  Pelvic: External genitalia:  no lesions              Urethra:  normal appearing urethra with no masses, tenderness or lesions              Bartholins and Skenes: normal                 Vagina: normal appearing vagina with normal color and discharge, no lesions              Cervix: no lesions              Bimanual Exam:  Uterus:  normal size, contour, position, consistency, mobility, non-tender and retroverted              Adnexa: no mass, fullness, tenderness               Chaperone was present for exam.  ASSESSMENT Emergency contraception Needs long term  contraception, can't tolerate hormonal contraception Screening STD, will discuss other STD testing at her annual exam next month    PLAN Discussed options of emergency contraception She would like to have a paragard IUD IUD placed Genprobe done F/U in one month for an annual exam and IUD check  An After Visit Summary was printed and given to the patient.

## 2016-08-27 NOTE — Patient Instructions (Signed)

## 2016-08-28 LAB — GC/CHLAMYDIA PROBE AMP
CT Probe RNA: NOT DETECTED
GC Probe RNA: NOT DETECTED

## 2016-09-23 ENCOUNTER — Encounter: Payer: Self-pay | Admitting: Obstetrics and Gynecology

## 2016-09-23 ENCOUNTER — Other Ambulatory Visit (HOSPITAL_COMMUNITY)
Admission: RE | Admit: 2016-09-23 | Discharge: 2016-09-23 | Disposition: A | Discharge status: Home / Self Care | Payer: BLUE CROSS/BLUE SHIELD | Source: Ambulatory Visit | Attending: Obstetrics and Gynecology | Admitting: Obstetrics and Gynecology

## 2016-09-23 ENCOUNTER — Ambulatory Visit (INDEPENDENT_AMBULATORY_CARE_PROVIDER_SITE_OTHER): Payer: BLUE CROSS/BLUE SHIELD | Admitting: Obstetrics and Gynecology

## 2016-09-23 VITALS — BP 90/58 | HR 84 | Resp 14 | Ht 66.0 in | Wt 112.0 lb

## 2016-09-23 DIAGNOSIS — Z113 Encounter for screening for infections with a predominantly sexual mode of transmission: Secondary | ICD-10-CM | POA: Diagnosis not present

## 2016-09-23 DIAGNOSIS — Z01419 Encounter for gynecological examination (general) (routine) without abnormal findings: Secondary | ICD-10-CM | POA: Diagnosis not present

## 2016-09-23 DIAGNOSIS — N939 Abnormal uterine and vaginal bleeding, unspecified: Secondary | ICD-10-CM | POA: Diagnosis not present

## 2016-09-23 DIAGNOSIS — R300 Dysuria: Secondary | ICD-10-CM

## 2016-09-23 DIAGNOSIS — Z0000 Encounter for general adult medical examination without abnormal findings: Secondary | ICD-10-CM | POA: Diagnosis not present

## 2016-09-23 DIAGNOSIS — R3915 Urgency of urination: Secondary | ICD-10-CM

## 2016-09-23 DIAGNOSIS — Z124 Encounter for screening for malignant neoplasm of cervix: Secondary | ICD-10-CM

## 2016-09-23 LAB — LIPID PANEL
CHOL/HDL RATIO: 2.6 ratio (ref <5.0)
CHOLESTEROL: 164 mg/dL (ref <200)
HDL: 62 mg/dL (ref >50)
LDL CALC: 92 mg/dL (ref <100)
Triglycerides: 51 mg/dL (ref <150)
VLDL: 10 mg/dL (ref <30)

## 2016-09-23 LAB — CBC
HCT: 41 % (ref 35.0–45.0)
HEMOGLOBIN: 13.4 g/dL (ref 11.7–15.5)
MCH: 30.7 pg (ref 27.0–33.0)
MCHC: 32.7 g/dL (ref 32.0–36.0)
MCV: 94 fL (ref 80.0–100.0)
MPV: 10.3 fL (ref 7.5–12.5)
Platelets: 238 10*3/uL (ref 140–400)
RBC: 4.36 MIL/uL (ref 3.80–5.10)
RDW: 12.8 % (ref 11.0–15.0)
WBC: 5.9 10*3/uL (ref 3.8–10.8)

## 2016-09-23 LAB — COMPREHENSIVE METABOLIC PANEL
ALT: 12 U/L (ref 6–29)
AST: 15 U/L (ref 10–30)
Albumin: 4 g/dL (ref 3.6–5.1)
Alkaline Phosphatase: 45 U/L (ref 33–115)
BUN: 16 mg/dL (ref 7–25)
CALCIUM: 9.1 mg/dL (ref 8.6–10.2)
CHLORIDE: 107 mmol/L (ref 98–110)
CO2: 24 mmol/L (ref 20–31)
Creat: 0.72 mg/dL (ref 0.50–1.10)
Glucose, Bld: 83 mg/dL (ref 65–99)
POTASSIUM: 4 mmol/L (ref 3.5–5.3)
SODIUM: 139 mmol/L (ref 135–146)
TOTAL PROTEIN: 6.6 g/dL (ref 6.1–8.1)
Total Bilirubin: 0.4 mg/dL (ref 0.2–1.2)

## 2016-09-23 LAB — POCT URINALYSIS DIPSTICK
BILIRUBIN UA: NEGATIVE
Glucose, UA: NEGATIVE
KETONES UA: NEGATIVE
Leukocytes, UA: NEGATIVE
Nitrite, UA: NEGATIVE
Protein, UA: NEGATIVE
Urobilinogen, UA: NEGATIVE E.U./dL — AB
pH, UA: 6 (ref 5.0–8.0)

## 2016-09-23 LAB — POCT URINE PREGNANCY: PREG TEST UR: NEGATIVE

## 2016-09-23 MED ORDER — SULFAMETHOXAZOLE-TRIMETHOPRIM 800-160 MG PO TABS: 1.0000 | ORAL_TABLET | Freq: Two times a day (BID) | ORAL | 0 refills | Status: AC

## 2016-09-23 MED ORDER — MEDROXYPROGESTERONE ACETATE 5 MG PO TABS: ORAL_TABLET | ORAL | 6 refills | Status: AC

## 2016-09-23 MED ORDER — PHENAZOPYRIDINE HCL 200 MG PO TABS: 200.0000 mg | ORAL_TABLET | Freq: Three times a day (TID) | ORAL | 0 refills | Status: AC | PRN

## 2016-09-23 NOTE — Progress Notes (Signed)
26 y.o. G0P0 SingleCaucasianF here for annual exam.   Period Duration (Days): bleeding since IUD Paragard placed 08-27-16  Period Pattern: (!) Irregular Menstrual Flow: Heavy Menstrual Control: Tampon Menstrual Control Change Freq (Hours): changes tampon every 2 hours Dysmenorrhea: (!) Moderate Dysmenorrhea Symptoms: Cramping  Prior to the paragard being placed last month, her cycles were typically every 6-8 weeks x 3-4 days. She was changing a tampon in 4-6 hours.  She has been bleeding since the paragard was placed, initially spotting, a week later it was like a full blown cycle. Bleed heavily for about a week. Saturating a regular tampon in 1.5-2 hours. Cramps are worse, helped with pamprin.  She has not tolerated hormonal contraception in the past, including the skyla IUD. She c/o a two day h/o urinary urgency, sensation she isn't completely emptying, dysuria.  Sexually active, same partner x 3 months. He hadn't had STD testing prior to this relationship. Using condoms.   Patient's last menstrual period was 08/24/2016. Cycle ended 4/23.          Sexually active: Yes.    The current method of family planning is IUD.    Exercising: Yes.    jogging Charletta Cousin Smoker:  no  Health Maintenance: Pap:  09-18-15 WNL 01-31-14 LSIL may include HPV or mild dysplasia  History of abnormal Pap:  yes TDaP:  07-11-08 Gardasil: completed all 3    reports that she quit smoking about 2 years ago. Her smoking use included Cigarettes. She has a 7.00 pack-year smoking history. She has never used smokeless tobacco. She reports that she does not drink alcohol or use drugs.Just graduated from [employer] in [employer].   Past Medical History:  Diagnosis Date  . Abnormal Pap smear of cervix    9/15 LSIL  . Anxiety   . Depression   . Dysmenorrhea   . Eczema   . Herpes simplex infection of eyelid   . History of recurrent UTIs   . PCOS (polycystic ovarian syndrome) 2007    Past Surgical History:  Procedure  Laterality Date  . INTRAUTERINE DEVICE (IUD) INSERTION  08/27/2016   Paragard    Current Outpatient Prescriptions  Medication Sig Dispense Refill  . clobetasol ointment (TEMOVATE) 0.05 %     . EPINEPHrine 0.3 mg/0.3 mL IJ SOAJ injection Inject 0.3 mg into the muscle as needed (for allergic reaction).     Marland Kitchen loratadine (CLARITIN) 10 MG tablet Take 10 mg by mouth daily.    Marland Kitchen PARAGARD INTRAUTERINE COPPER IU by Intrauterine route.     No current facility-administered medications for this visit.     Family History  Problem Relation Age of Onset  . Colon cancer Mother   . Hypertension Mother   . Ovarian cysts Mother   . Osteoporosis Maternal Grandmother   . Brain cancer Paternal Grandfather   Mom was 59 with diagnosis of colon cancer. Still living  Review of Systems  Constitutional: Negative.   HENT: Negative.   Eyes: Negative.   Respiratory: Negative.   Cardiovascular: Negative.   Gastrointestinal: Negative.   Endocrine: Negative.   Genitourinary: Positive for menstrual problem.       Heavy menstrual bleeding  Dysmenorrhea   Musculoskeletal: Negative.   Skin: Negative.   Allergic/Immunologic: Negative.   Neurological: Negative.   Psychiatric/Behavioral: Negative.     Exam:   BP (!) 90/58 (BP Location: Right Arm, Patient Position: Sitting, Cuff Size: Normal)   Pulse 84   Resp 14   Ht 5\' 6"  (1.676 m)  Wt 112 lb (50.8 kg)   LMP 08/24/2016   BMI 18.08 kg/m   Weight change: @WEIGHTCHANGE @ Height:   Height: 5\' 6"  (167.6 cm)  Ht Readings from Last 3 Encounters:  09/23/16 5\' 6"  (1.676 m)  09/18/15 5\' 6"  (1.676 m)  07/18/14 5\' 6"  (1.676 m)    General appearance: alert, cooperative and appears stated age Head: Normocephalic, without obvious abnormality, atraumatic Neck: no adenopathy, supple, symmetrical, trachea midline and thyroid normal to inspection and palpation Lungs: clear to auscultation bilaterally Cardiovascular: regular rate and rhythm Breasts: normal  appearance, no masses or tenderness Abdomen: soft, non-tender; bowel sounds normal; no masses,  no organomegaly Extremities: extremities normal, atraumatic, no cyanosis or edema Skin: Skin color, texture, turgor normal. No rashes or lesions Lymph nodes: Cervical, supraclavicular, and axillary nodes normal. No abnormal inguinal nodes palpated Neurologic: Grossly normal   Pelvic: External genitalia:  no lesions              Urethra:  normal appearing urethra with no masses, tenderness or lesions              Bartholins and Skenes: normal                 Vagina: normal appearing vagina with normal color and discharge, no lesions              Cervix: no lesions and IUD string 3 cm               Bimanual Exam:  Uterus:  normal size, contour, position, consistency, mobility, non-tender, retroverted              Adnexa: no mass, fullness, tenderness               Rectovaginal: Confirms               Anus:  normal sphincter tone, no lesions  Chaperone was present for exam.  A:  Well Woman with normal exam  H/O LSIL in 2015  Bladder symptoms c/w UTI  Abnormal bleeding since the IUD was inserted, she has a h/o oligomenorrhea and PCOS  P:   STD testing  Pap with reflex hpv  CBC, CMP, lipids, TSH, prolactin  UPT  Urine for ua, c&s  Will treat with bactrim and pyridium  Will do a low dose, short course of provera (she doesn't tolerate hormones well)

## 2016-09-23 NOTE — Patient Instructions (Addendum)
EXERCISE AND DIET:  We recommended that you start or continue a regular exercise program for good health. Regular exercise means any activity that makes your heart beat faster and makes you sweat.  We recommend exercising at least 30 minutes per day at least 3 days a week, preferably 4 or 5.  We also recommend a diet low in fat and sugar.  Inactivity, poor dietary choices and obesity can cause diabetes, heart attack, stroke, and kidney damage, among others.    ALCOHOL AND SMOKING:  Women should limit their alcohol intake to no more than 7 drinks/beers/glasses of wine (combined, not each!) per week. Moderation of alcohol intake to this level decreases your risk of breast cancer and liver damage. And of course, no recreational drugs are part of a healthy lifestyle.  And absolutely no smoking or even second hand smoke. Most people know smoking can cause heart and lung diseases, but did you know it also contributes to weakening of your bones? Aging of your skin?  Yellowing of your teeth and nails?  CALCIUM AND VITAMIN D:  Adequate intake of calcium and Vitamin D are recommended.  The recommendations for exact amounts of these supplements seem to change often, but generally speaking 600 mg of calcium (either carbonate or citrate) and 800 units of Vitamin D per day seems prudent. Certain women may benefit from higher intake of Vitamin D.  If you are among these women, your doctor will have told you during your visit.    PAP SMEARS:  Pap smears, to check for cervical cancer or precancers,  have traditionally been done yearly, although recent scientific advances have shown that most women can have pap smears less often.  However, every woman still should have a physical exam from her gynecologist every year. It will include a breast check, inspection of the vulva and vagina to check for abnormal growths or skin changes, a visual exam of the cervix, and then an exam to evaluate the size and shape of the uterus and  ovaries.  And after 27 years of age, a rectal exam is indicated to check for rectal cancers. We will also provide age appropriate advice regarding health maintenance, like when you should have certain vaccines, screening for sexually transmitted diseases, bone density testing, colonoscopy, mammograms, etc.   MAMMOGRAMS:  All women over 40 years old should have a yearly mammogram. Many facilities now offer a "3D" mammogram, which may cost around $50 extra out of pocket. If possible,  we recommend you accept the option to have the 3D mammogram performed.  It both reduces the number of women who will be called back for extra views which then turn out to be normal, and it is better than the routine mammogram at detecting truly abnormal areas.    COLONOSCOPY:  Colonoscopy to screen for colon cancer is recommended for all women at age 50.  We know, you hate the idea of the prep.  We agree, BUT, having colon cancer and not knowing it is worse!!  Colon cancer so often starts as a polyp that can be seen and removed at colonscopy, which can quite literally save your life!  And if your first colonoscopy is normal and you have no family history of colon cancer, most women don't have to have it again for 10 years.  Once every ten years, you can do something that may end up saving your life, right?  We will be happy to help you get it scheduled when you are ready.    Be sure to check your insurance coverage so you understand how much it will cost.  It may be covered as a preventative service at no cost, but you should check your particular policy.      Urinary Tract Infection, Adult A urinary tract infection (UTI) is an infection of any part of the urinary tract, which includes the kidneys, ureters, bladder, and urethra. These organs make, store, and get rid of urine in the body. UTI can be a bladder infection (cystitis) or kidney infection (pyelonephritis). What are the causes? This infection may be caused by fungi,  viruses, or bacteria. Bacteria are the most common cause of UTIs. This condition can also be caused by repeated incomplete emptying of the bladder during urination. What increases the risk? This condition is more likely to develop if:  You ignore your need to urinate or hold urine for long periods of time.  You do not empty your bladder completely during urination.  You wipe back to front after urinating or having a bowel movement, if you are female.  You are uncircumcised, if you are female.  You are constipated.  You have a urinary catheter that stays in place (indwelling).  You have a weak defense (immune) system.  You have a medical condition that affects your bowels, kidneys, or bladder.  You have diabetes.  You take antibiotic medicines frequently or for long periods of time, and the antibiotics no longer work well against certain types of infections (antibiotic resistance).  You take medicines that irritate your urinary tract.  You are exposed to chemicals that irritate your urinary tract.  You are female. What are the signs or symptoms? Symptoms of this condition include:  Fever.  Frequent urination or passing small amounts of urine frequently.  Needing to urinate urgently.  Pain or burning with urination.  Urine that smells bad or unusual.  Cloudy urine.  Pain in the lower abdomen or back.  Trouble urinating.  Blood in the urine.  Vomiting or being less hungry than normal.  Diarrhea or abdominal pain.  Vaginal discharge, if you are female. How is this diagnosed? This condition is diagnosed with a medical history and physical exam. You will also need to provide a urine sample to test your urine. Other tests may be done, including:  Blood tests.  Sexually transmitted disease (STD) testing. If you have had more than one UTI, a cystoscopy or imaging studies may be done to determine the cause of the infections. How is this treated? Treatment for this  condition often includes a combination of two or more of the following:  Antibiotic medicine.  Other medicines to treat less common causes of UTI.  Over-the-counter medicines to treat pain.  Drinking enough water to stay hydrated. Follow these instructions at home:  Take over-the-counter and prescription medicines only as told by your health care provider.  If you were prescribed an antibiotic, take it as told by your health care provider. Do not stop taking the antibiotic even if you start to feel better.  Avoid alcohol, caffeine, tea, and carbonated beverages. They can irritate your bladder.  Drink enough fluid to keep your urine clear or pale yellow.  Keep all follow-up visits as told by your health care provider. This is important.  Make sure to:  Empty your bladder often and completely. Do not hold urine for long periods of time.  Empty your bladder before and after sex.  Wipe from front to back after a bowel movement if you are  female. Use each tissue one time when you wipe. Contact a health care provider if:  You have back pain.  You have a fever.  You feel nauseous or vomit.  Your symptoms do not get better after 3 days.  Your symptoms go away and then return. Get help right away if:  You have severe back pain or lower abdominal pain.  You are vomiting and cannot keep down any medicines or water. This information is not intended to replace advice given to you by your health care provider. Make sure you discuss any questions you have with your health care provider. Document Released: 01/28/2005 Document Revised: 10/02/2015 Document Reviewed: 03/11/2015 Elsevier Interactive Patient Education  2017 [employer]  Breast Self-Awareness Breast self-awareness means being familiar with how your breasts look and feel. It involves checking your breasts regularly and reporting any changes to your health care provider. Practicing breast self-awareness is important. A  change in your breasts can be a sign of a serious medical problem. Being familiar with how your breasts look and feel allows you to find any problems early, when treatment is more likely to be successful. All women should practice breast self-awareness, including women who have had breast implants. How to do a breast self-exam One way to learn what is normal for your breasts and whether your breasts are changing is to do a breast self-exam. To do a breast self-exam: Look for Changes   1. Remove all the clothing above your waist. 2. Stand in front of a mirror in a room with good lighting. 3. Put your hands on your hips. 4. Push your hands firmly downward. 5. Compare your breasts in the mirror. Look for differences between them (asymmetry), such as:  Differences in shape.  Differences in size.  Puckers, dips, and bumps in one breast and not the other. 6. Look at each breast for changes in your skin, such as:  Redness.  Scaly areas. 7. Look for changes in your nipples, such as:  Discharge.  Bleeding.  Dimpling.  Redness.  A change in position. Feel for Changes   Carefully feel your breasts for lumps and changes. It is best to do this while lying on your back on the floor and again while sitting or standing in the shower or tub with soapy water on your skin. Feel each breast in the following way:  Place the arm on the side of the breast you are examining above your head.  Feel your breast with the other hand.  Start in the nipple area and make  inch (2 cm) overlapping circles to feel your breast. Use the pads of your three middle fingers to do this. Apply light pressure, then medium pressure, then firm pressure. The light pressure will allow you to feel the tissue closest to the skin. The medium pressure will allow you to feel the tissue that is a little deeper. The firm pressure will allow you to feel the tissue close to the ribs.  Continue the overlapping circles, moving  downward over the breast until you feel your ribs below your breast.  Move one finger-width toward the center of the body. Continue to use the  inch (2 cm) overlapping circles to feel your breast as you move slowly up toward your collarbone.  Continue the up and down exam using all three pressures until you reach your armpit. Write Down What You Find   Write down what is normal for each breast and any changes that you find. Keep  a written record with breast changes or normal findings for each breast. By writing this information down, you do not need to depend only on memory for size, tenderness, or location. Write down where you are in your menstrual cycle, if you are still menstruating. If you are having trouble noticing differences in your breasts, do not get discouraged. With time you will become more familiar with the variations in your breasts and more comfortable with the exam. How often should I examine my breasts? Examine your breasts every month. If you are breastfeeding, the best time to examine your breasts is after a feeding or after using a breast pump. If you menstruate, the best time to examine your breasts is 5-7 days after your period is over. During your period, your breasts are lumpier, and it may be more difficult to notice changes. When should I see my health care provider? See your health care provider if you notice:  A change in shape or size of your breasts or nipples.  A change in the skin of your breast or nipples, such as a reddened or scaly area.  Unusual discharge from your nipples.  A lump or thick area that was not there before.  Pain in your breasts.  Anything that concerns you. This information is not intended to replace advice given to you by your health care provider. Make sure you discuss any questions you have with your health care provider. Document Released: 04/20/2005 Document Revised: 09/26/2015 Document Reviewed: 03/10/2015 Elsevier Interactive  Patient Education  2017 [employer].

## 2016-09-24 LAB — URINALYSIS, MICROSCOPIC ONLY
Bacteria, UA: NONE SEEN [HPF]
CRYSTALS: NONE SEEN [HPF]
Casts: NONE SEEN [LPF]
Yeast: NONE SEEN [HPF]

## 2016-09-24 LAB — URINE CULTURE: ORGANISM ID, BACTERIA: NO GROWTH

## 2016-09-24 LAB — STD PANEL
HIV: NONREACTIVE
Hepatitis B Surface Ag: NEGATIVE

## 2016-09-24 LAB — PROLACTIN: Prolactin: 18.3 ng/mL

## 2016-09-24 LAB — TSH: TSH: 2.24 mIU/L

## 2016-09-24 LAB — HEPATITIS C ANTIBODY: HCV Ab: NEGATIVE

## 2016-09-25 ENCOUNTER — Telehealth: Payer: Self-pay | Admitting: *Deleted

## 2016-09-25 LAB — CYTOLOGY - PAP
Chlamydia: NEGATIVE
Diagnosis: NEGATIVE
NEISSERIA GONORRHEA: NEGATIVE
TRICH (WINDOWPATH): NEGATIVE

## 2016-09-25 NOTE — Telephone Encounter (Signed)
Left message to call Algie Westry at [phone].  

## 2016-09-25 NOTE — Telephone Encounter (Signed)
-----   Message from Romualdo BolkJill Evelyn Jertson, MD sent at 09/25/2016 10:19 AM EDT ----- Please call the patient, tell her that her urine culture was negative and that she can stop the antibiotics.

## 2016-09-29 NOTE — Telephone Encounter (Signed)
Spoke with patient, advised of results as seen below per Dr. Oscar LaJertson. Patient asking about additional lab results from same day, advised of MyChart message as seen below.  Patient verbalizes understanding and is agreeable.   Routing to provider for final review. Patient is agreeable to disposition. Will close encounter.     Notes recorded by Romualdo BolkJertson, Allani Reber Evelyn, MD on 09/24/2016 at 3:50 PM EDT Hi Savannah Patel, Your blood work was all normal. Your urinalysis was normal. Your urine culture and pap smear are pending.  Gertie ExonJill Jertson
